# Patient Record
Sex: Male | Born: 1965 | Race: Black or African American | Hispanic: No | Marital: Single | Smoking: Former smoker
Health system: Southern US, Community
[De-identification: ages and names within clinical notes are randomized; demographics above are authoritative.]

## PROBLEM LIST (undated history)

## (undated) ENCOUNTER — Emergency Department: Admission: EM | Discharge status: Absent without leave | Payer: Medicare Other

## (undated) DIAGNOSIS — K219 Gastro-esophageal reflux disease without esophagitis: Secondary | ICD-10-CM

## (undated) DIAGNOSIS — I10 Essential (primary) hypertension: Secondary | ICD-10-CM

## (undated) HISTORY — PX: OTHER SURGICAL HISTORY: SHX169

---

## 2004-05-02 ENCOUNTER — Other Ambulatory Visit: Payer: Self-pay

## 2004-05-08 ENCOUNTER — Other Ambulatory Visit: Payer: Self-pay

## 2004-11-19 ENCOUNTER — Emergency Department: Payer: Self-pay | Admitting: Emergency Medicine

## 2004-12-18 ENCOUNTER — Emergency Department: Payer: Self-pay | Admitting: Emergency Medicine

## 2005-05-22 ENCOUNTER — Other Ambulatory Visit: Payer: Self-pay

## 2005-05-22 ENCOUNTER — Emergency Department: Payer: Self-pay | Admitting: Emergency Medicine

## 2005-07-16 ENCOUNTER — Inpatient Hospital Stay: Payer: Self-pay | Admitting: Psychiatry

## 2005-09-28 ENCOUNTER — Emergency Department: Payer: Self-pay | Admitting: Emergency Medicine

## 2005-09-28 ENCOUNTER — Other Ambulatory Visit: Payer: Self-pay

## 2005-11-19 ENCOUNTER — Emergency Department: Payer: Self-pay | Admitting: Internal Medicine

## 2005-11-20 ENCOUNTER — Emergency Department: Payer: Self-pay | Admitting: Emergency Medicine

## 2005-11-20 ENCOUNTER — Other Ambulatory Visit: Payer: Self-pay

## 2005-12-02 ENCOUNTER — Emergency Department: Payer: Self-pay | Admitting: Emergency Medicine

## 2006-03-19 ENCOUNTER — Other Ambulatory Visit: Payer: Self-pay

## 2006-03-19 ENCOUNTER — Emergency Department: Payer: Self-pay | Admitting: Internal Medicine

## 2006-06-12 ENCOUNTER — Other Ambulatory Visit: Payer: Self-pay

## 2006-06-12 ENCOUNTER — Emergency Department: Payer: Self-pay | Admitting: Emergency Medicine

## 2006-07-13 ENCOUNTER — Emergency Department: Payer: Self-pay | Admitting: Emergency Medicine

## 2006-07-13 ENCOUNTER — Other Ambulatory Visit: Payer: Self-pay

## 2006-07-16 ENCOUNTER — Ambulatory Visit: Payer: Self-pay | Admitting: Internal Medicine

## 2006-08-05 ENCOUNTER — Other Ambulatory Visit: Payer: Self-pay

## 2006-08-05 ENCOUNTER — Emergency Department: Payer: Self-pay | Admitting: Emergency Medicine

## 2006-08-10 ENCOUNTER — Other Ambulatory Visit: Payer: Self-pay

## 2006-08-10 ENCOUNTER — Emergency Department: Payer: Self-pay | Admitting: Emergency Medicine

## 2006-08-11 ENCOUNTER — Other Ambulatory Visit: Payer: Self-pay

## 2006-08-11 ENCOUNTER — Emergency Department: Payer: Self-pay | Admitting: Emergency Medicine

## 2008-03-22 ENCOUNTER — Ambulatory Visit: Payer: Self-pay | Admitting: Internal Medicine

## 2009-04-20 ENCOUNTER — Emergency Department: Payer: Self-pay | Admitting: Emergency Medicine

## 2009-05-16 ENCOUNTER — Ambulatory Visit: Payer: Self-pay | Admitting: Cardiovascular Disease

## 2010-01-31 ENCOUNTER — Emergency Department: Payer: Self-pay | Admitting: Emergency Medicine

## 2010-06-03 ENCOUNTER — Emergency Department: Payer: Self-pay | Admitting: Emergency Medicine

## 2011-06-16 ENCOUNTER — Emergency Department: Payer: Self-pay | Admitting: Emergency Medicine

## 2012-03-11 ENCOUNTER — Emergency Department: Payer: Self-pay | Admitting: Internal Medicine

## 2012-03-22 ENCOUNTER — Emergency Department: Payer: Self-pay | Admitting: Emergency Medicine

## 2012-05-20 ENCOUNTER — Emergency Department: Payer: Self-pay | Admitting: Emergency Medicine

## 2012-08-25 ENCOUNTER — Emergency Department: Payer: Self-pay | Admitting: Emergency Medicine

## 2012-08-25 LAB — URINALYSIS, COMPLETE
Bilirubin,UR: NEGATIVE
Ketone: NEGATIVE
Leukocyte Esterase: NEGATIVE
Protein: 30
RBC,UR: 14 /HPF (ref 0–5)
Squamous Epithelial: 1
WBC UR: 1 /HPF (ref 0–5)

## 2012-08-25 LAB — CBC
HGB: 13.3 g/dL (ref 13.0–18.0)
MCH: 29.9 pg (ref 26.0–34.0)
MCHC: 33.1 g/dL (ref 32.0–36.0)
MCV: 90 fL (ref 80–100)
Platelet: 236 10*3/uL (ref 150–440)
RDW: 12.8 % (ref 11.5–14.5)

## 2012-08-25 LAB — BASIC METABOLIC PANEL
BUN: 23 mg/dL — ABNORMAL HIGH (ref 7–18)
Chloride: 107 mmol/L (ref 98–107)
Creatinine: 1.47 mg/dL — ABNORMAL HIGH (ref 0.60–1.30)
EGFR (Non-African Amer.): 57 — ABNORMAL LOW
Glucose: 122 mg/dL — ABNORMAL HIGH (ref 65–99)
Osmolality: 288 (ref 275–301)
Potassium: 4.3 mmol/L (ref 3.5–5.1)

## 2012-08-25 LAB — DRUG SCREEN, URINE
Amphetamines, Ur Screen: NEGATIVE (ref ?–1000)
Cocaine Metabolite,Ur ~~LOC~~: NEGATIVE (ref ?–300)
MDMA (Ecstasy)Ur Screen: NEGATIVE (ref ?–500)
Opiate, Ur Screen: NEGATIVE (ref ?–300)
Tricyclic, Ur Screen: NEGATIVE (ref ?–1000)

## 2013-04-02 ENCOUNTER — Emergency Department: Payer: Self-pay | Admitting: Emergency Medicine

## 2013-08-25 ENCOUNTER — Emergency Department: Payer: Self-pay | Admitting: Emergency Medicine

## 2013-11-04 ENCOUNTER — Emergency Department: Payer: Self-pay | Admitting: Emergency Medicine

## 2014-03-04 ENCOUNTER — Emergency Department: Payer: Self-pay | Admitting: Emergency Medicine

## 2014-03-04 LAB — COMPREHENSIVE METABOLIC PANEL
ALK PHOS: 97 U/L
ALT: 29 U/L (ref 12–78)
Albumin: 4 g/dL (ref 3.4–5.0)
Anion Gap: 5 — ABNORMAL LOW (ref 7–16)
BUN: 13 mg/dL (ref 7–18)
Bilirubin,Total: 0.4 mg/dL (ref 0.2–1.0)
CHLORIDE: 103 mmol/L (ref 98–107)
CREATININE: 1.49 mg/dL — AB (ref 0.60–1.30)
Calcium, Total: 9.3 mg/dL (ref 8.5–10.1)
Co2: 30 mmol/L (ref 21–32)
EGFR (African American): >60
GFR CALC NON AF AMER: 55 — AB
Glucose: 130 mg/dL — ABNORMAL HIGH (ref 65–99)
OSMOLALITY: 278 (ref 275–301)
Potassium: 3.5 mmol/L (ref 3.5–5.1)
SGOT(AST): 38 U/L — ABNORMAL HIGH (ref 15–37)
SODIUM: 138 mmol/L (ref 136–145)
TOTAL PROTEIN: 7.9 g/dL (ref 6.4–8.2)

## 2014-03-04 LAB — CBC
HCT: 44.5 % (ref 40.0–52.0)
HGB: 15 g/dL (ref 13.0–18.0)
MCH: 30.5 pg (ref 26.0–34.0)
MCHC: 33.7 g/dL (ref 32.0–36.0)
MCV: 90 fL (ref 80–100)
PLATELETS: 224 10*3/uL (ref 150–440)
RBC: 4.93 10*6/uL (ref 4.40–5.90)
RDW: 13 % (ref 11.5–14.5)
WBC: 9.7 10*3/uL (ref 3.8–10.6)

## 2014-03-04 LAB — ETHANOL: Ethanol %: <0.003 % (ref 0.000–0.080)

## 2014-03-04 LAB — SALICYLATE LEVEL: Salicylates, Serum: 3.6 mg/dL — ABNORMAL HIGH

## 2014-03-04 LAB — ACETAMINOPHEN LEVEL: Acetaminophen: <2 ug/mL

## 2014-03-05 LAB — URINALYSIS, COMPLETE
BILIRUBIN, UR: NEGATIVE
Bacteria: NONE SEEN
Blood: NEGATIVE
GLUCOSE, UR: NEGATIVE mg/dL (ref 0–75)
Hyaline Cast: 1
Ketone: NEGATIVE
LEUKOCYTE ESTERASE: NEGATIVE
NITRITE: NEGATIVE
Ph: 5 (ref 4.5–8.0)
Protein: 30
RBC,UR: <1 /HPF (ref 0–5)
Specific Gravity: 1.015 (ref 1.003–1.030)
Squamous Epithelial: <1
WBC UR: 5 /HPF (ref 0–5)

## 2014-03-05 LAB — DRUG SCREEN, URINE

## 2014-03-07 ENCOUNTER — Emergency Department: Payer: Self-pay | Admitting: Emergency Medicine

## 2014-03-07 LAB — CBC
HCT: 44.3 % (ref 40.0–52.0)
HGB: 14.2 g/dL (ref 13.0–18.0)
MCH: 29 pg (ref 26.0–34.0)
MCHC: 32.1 g/dL (ref 32.0–36.0)
MCV: 91 fL (ref 80–100)
PLATELETS: 224 10*3/uL (ref 150–440)
RBC: 4.9 10*6/uL (ref 4.40–5.90)
RDW: 12.6 % (ref 11.5–14.5)
WBC: 9.9 10*3/uL (ref 3.8–10.6)

## 2014-03-07 LAB — DRUG SCREEN, URINE
Amphetamines, Ur Screen: NEGATIVE (ref ?–1000)
BARBITURATES, UR SCREEN: NEGATIVE (ref ?–200)
BENZODIAZEPINE, UR SCRN: NEGATIVE (ref ?–200)
CANNABINOID 50 NG, UR ~~LOC~~: NEGATIVE (ref ?–50)
COCAINE METABOLITE, UR ~~LOC~~: NEGATIVE (ref ?–300)
MDMA (Ecstasy)Ur Screen: NEGATIVE (ref ?–500)
Methadone, Ur Screen: NEGATIVE (ref ?–300)
Opiate, Ur Screen: NEGATIVE (ref ?–300)
Phencyclidine (PCP) Ur S: NEGATIVE (ref ?–25)
Tricyclic, Ur Screen: NEGATIVE (ref ?–1000)

## 2014-03-07 LAB — ETHANOL: Ethanol: <3 mg/dL

## 2014-03-07 LAB — COMPREHENSIVE METABOLIC PANEL
ALBUMIN: 4 g/dL (ref 3.4–5.0)
AST: 42 U/L — AB (ref 15–37)
Alkaline Phosphatase: 93 U/L
Anion Gap: 5 — ABNORMAL LOW (ref 7–16)
BUN: 12 mg/dL (ref 7–18)
Bilirubin,Total: 0.3 mg/dL (ref 0.2–1.0)
CREATININE: 1.47 mg/dL — AB (ref 0.60–1.30)
Calcium, Total: 8.9 mg/dL (ref 8.5–10.1)
Chloride: 102 mmol/L (ref 98–107)
Co2: 29 mmol/L (ref 21–32)
EGFR (African American): >60
EGFR (Non-African Amer.): 56 — ABNORMAL LOW
Glucose: 119 mg/dL — ABNORMAL HIGH (ref 65–99)
Osmolality: 273 (ref 275–301)
Potassium: 3.8 mmol/L (ref 3.5–5.1)
SGPT (ALT): 29 U/L (ref 12–78)
Sodium: 136 mmol/L (ref 136–145)
TOTAL PROTEIN: 7.7 g/dL (ref 6.4–8.2)

## 2014-03-07 LAB — URINALYSIS, COMPLETE
BACTERIA: NONE SEEN
BLOOD: NEGATIVE
Bilirubin,UR: NEGATIVE
GLUCOSE, UR: NEGATIVE mg/dL (ref 0–75)
KETONE: NEGATIVE
LEUKOCYTE ESTERASE: NEGATIVE
NITRITE: NEGATIVE
Ph: 5 (ref 4.5–8.0)
RBC,UR: 1 /HPF (ref 0–5)
SQUAMOUS EPITHELIAL: NONE SEEN
Specific Gravity: 1.012 (ref 1.003–1.030)
WBC UR: 3 /HPF (ref 0–5)

## 2014-11-10 ENCOUNTER — Emergency Department: Payer: Self-pay | Admitting: Internal Medicine

## 2014-11-10 LAB — CBC
HCT: 41.2 % (ref 40.0–52.0)
HGB: 13.3 g/dL (ref 13.0–18.0)
MCH: 29.7 pg (ref 26.0–34.0)
MCHC: 32.4 g/dL (ref 32.0–36.0)
MCV: 92 fL (ref 80–100)
Platelet: 203 10*3/uL (ref 150–440)
RBC: 4.5 10*6/uL (ref 4.40–5.90)
RDW: 12.5 % (ref 11.5–14.5)
WBC: 6.2 10*3/uL (ref 3.8–10.6)

## 2014-11-10 LAB — BASIC METABOLIC PANEL
Anion Gap: 5 — ABNORMAL LOW (ref 7–16)
BUN: 12 mg/dL (ref 7–18)
Calcium, Total: 8.3 mg/dL — ABNORMAL LOW (ref 8.5–10.1)
Chloride: 106 mmol/L (ref 98–107)
Co2: 28 mmol/L (ref 21–32)
Creatinine: 1.46 mg/dL — ABNORMAL HIGH (ref 0.60–1.30)
EGFR (African American): >60
EGFR (Non-African Amer.): 55 — ABNORMAL LOW
Glucose: 88 mg/dL (ref 65–99)
OSMOLALITY: 277 (ref 275–301)
POTASSIUM: 3.8 mmol/L (ref 3.5–5.1)
SODIUM: 139 mmol/L (ref 136–145)

## 2014-11-10 LAB — TROPONIN I

## 2015-03-11 ENCOUNTER — Emergency Department
Admission: EM | Admit: 2015-03-11 | Discharge: 2015-03-11 | Disposition: A | Discharge status: Home / Self Care | Payer: Medicare Other | Attending: Emergency Medicine | Admitting: Emergency Medicine

## 2015-03-11 DIAGNOSIS — I10 Essential (primary) hypertension: Secondary | ICD-10-CM | POA: Insufficient documentation

## 2015-03-11 DIAGNOSIS — Z79899 Other long term (current) drug therapy: Secondary | ICD-10-CM | POA: Insufficient documentation

## 2015-03-11 DIAGNOSIS — Y9289 Other specified places as the place of occurrence of the external cause: Secondary | ICD-10-CM | POA: Diagnosis not present

## 2015-03-11 DIAGNOSIS — Y9389 Activity, other specified: Secondary | ICD-10-CM | POA: Diagnosis not present

## 2015-03-11 DIAGNOSIS — S46211A Strain of muscle, fascia and tendon of other parts of biceps, right arm, initial encounter: Secondary | ICD-10-CM | POA: Diagnosis not present

## 2015-03-11 DIAGNOSIS — X58XXXA Exposure to other specified factors, initial encounter: Secondary | ICD-10-CM | POA: Insufficient documentation

## 2015-03-11 DIAGNOSIS — Z7951 Long term (current) use of inhaled steroids: Secondary | ICD-10-CM | POA: Diagnosis not present

## 2015-03-11 DIAGNOSIS — Z720 Tobacco use: Secondary | ICD-10-CM | POA: Diagnosis not present

## 2015-03-11 DIAGNOSIS — Y990 Civilian activity done for income or pay: Secondary | ICD-10-CM | POA: Insufficient documentation

## 2015-03-11 DIAGNOSIS — S4991XA Unspecified injury of right shoulder and upper arm, initial encounter: Secondary | ICD-10-CM | POA: Diagnosis present

## 2015-03-11 HISTORY — DX: Essential (primary) hypertension: I10

## 2015-03-11 MED ORDER — CYCLOBENZAPRINE HCL 10 MG PO TABS: 10.0000 mg | ORAL_TABLET | Freq: Three times a day (TID) | ORAL | Status: DC | PRN

## 2015-03-11 NOTE — ED Provider Notes (Signed)
[facility] Emergency Department Provider Note    ____________________________________________  Time seen: 0 857  I have reviewed the triage vital signs and the nursing notes.   HISTORY  Chief Complaint Arm Injury       HPI Trevor Harmon is a 49 y.o. male who presents with pain in the right upper arm. He reports he's been lifting heavy cases of water at work. Patient denies any recent injury. Denies history of the same. He has not been taking any medications over-the-counter.     Past Medical History  Diagnosis Date  . Hypertension     There are no active problems to display for this patient.   History reviewed. No pertinent past surgical history.  Current Outpatient Rx  Name  Route  Sig  Dispense  Refill  . ARIPiprazole (ABILIFY) 10 MG tablet   Oral   Take 10 mg by mouth daily.         . Fluticasone-Salmeterol (ADVAIR) 250-50 MCG/DOSE AEPB   Inhalation   Inhale 1 puff into the lungs 2 (two) times daily.         . potassium chloride (K-DUR) 10 MEQ tablet   Oral   Take 10 mEq by mouth daily.         . pravastatin (PRAVACHOL) 40 MG tablet   Oral   Take 40 mg by mouth daily.         . propranolol (INDERAL) 20 MG tablet   Oral   Take 20 mg by mouth 3 (three) times daily.         . ranitidine (ZANTAC) 150 MG tablet   Oral   Take 150 mg by mouth 2 (two) times daily.         . traZODone (DESYREL) 100 MG tablet   Oral   Take 100 mg by mouth at bedtime.         . triamterene-hydrochlorothiazide (DYAZIDE) 37.5-25 MG per capsule   Oral   Take 1 capsule by mouth daily.         . Vitamin D, Ergocalciferol, (DRISDOL) 50000 UNITS CAPS capsule   Oral   Take 50,000 Units by mouth every 7 (seven) days.         . cyclobenzaprine (FLEXERIL) 10 MG tablet   Oral   Take 1 tablet (10 mg total) by mouth 3 (three) times daily as needed for muscle spasms.   30 tablet   0     Allergies Review of patient's allergies  indicates no known allergies.  No family history on file.  Social History History  Substance Use Topics  . Smoking status: Current Every Day Smoker -- 0.50 packs/day    Types: Cigarettes  . Smokeless tobacco: Not on file  . Alcohol Use: No    Review of Systems  Constitutional: Negative for fever. Eyes: Negative for visual changes. ENT:  Cardiovascular: Negative for chest pain. Respiratory: Negative for shortness of breath. Gastrointestinal: Negative for abdominal pain, vomiting and diarrhea. Musculoskeletal: Negative for back pain. Skin: Negative for rash.  10-point ROS otherwise negative.  ____________________________________________   PHYSICAL EXAM:  VITAL SIGNS: ED Triage Vitals  Enc Vitals Group     BP 03/11/15 0811 121/78 mmHg     Pulse Rate 03/11/15 0811 62     Resp 03/11/15 0811 18     Temp 03/11/15 0811 98.4 F (36.9 C)     Temp Source 03/11/15 0811 Oral     SpO2 03/11/15 0811 98 %  Weight 03/11/15 0811 160 lb (72.576 kg)     Height 03/11/15 0811 6' (1.829 m)     Head Cir --      Peak Flow --      Pain Score 03/11/15 0811 10     Pain Loc --      Pain Edu? --      Excl. in GC? --      Constitutional: Alert and oriented. Well appearing and in no distress. Eyes: Conjunctivae are normal. PERRL. Normal extraocular movements. ENT   Head: Normocephalic and atraumatic.   Nose: No congestion/rhinnorhea.   Mouth/Throat: Mucous membranes are moist.   Neck: No stridor. Respiratory: Normal respiratory effort without tachypnea nor retractions. Gastrointestinal: Soft and nontender. Musculoskeletal: Tenderness over her bicep on the right upper extremity. Normal range of motion. No indication of trauma.  Neurologic:  Normal speech and language. No gross focal neurologic deficits are appreciated. Speech is normal. No gait instability. Skin:  Skin is warm, dry and intact. No rash noted. No contusion or ecchymosis. Psychiatric: Mood and affect are  normal. Speech and behavior are normal. Patient exhibits appropriate insight and judgment.  ____________________________________________   EKG    ____________________________________________    RADIOLOGY    ____________________________________________   PROCEDURES  Procedure(s) performed: None  Critical Care performed: No  ____________________________________________   INITIAL IMPRESSION / ASSESSMENT AND PLAN / ED COURSE  Pertinent labs & imaging results that were available during my care of the patient were reviewed by me and considered in my medical decision making (see chart for details).  Patient was prescribed Flexeril. Encouraged him to continue his NSAIDs. Advised to rest ice and elevate. Work note provided for the next 2 days. Patient to follow up with his primary care provider if he is not doing better over the next 5-7 days.  ____________________________________________   FINAL CLINICAL IMPRESSION(S) / ED DIAGNOSES  Final diagnoses:  Biceps muscle strain, right, initial encounter    Chinita PesterCari B Kegan Mckeithan, FNP 03/11/15 1041  Sharman CheekPhillip Stafford, MD 03/12/15 1314

## 2015-03-11 NOTE — ED Notes (Signed)
Pt reports no injury he knows of but last night after work his right arm started hurting.  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  Reports pain with movement to right shoulder and elbow. + pulse, no deformity. Cap refill < 3sec

## 2015-11-29 ENCOUNTER — Emergency Department
Admission: EM | Admit: 2015-11-29 | Discharge: 2015-11-29 | Disposition: A | Discharge status: Home / Self Care | Payer: Medicare Other | Attending: Emergency Medicine | Admitting: Emergency Medicine

## 2015-11-29 DIAGNOSIS — L03811 Cellulitis of head [any part, except face]: Secondary | ICD-10-CM | POA: Insufficient documentation

## 2015-11-29 DIAGNOSIS — F17210 Nicotine dependence, cigarettes, uncomplicated: Secondary | ICD-10-CM | POA: Diagnosis not present

## 2015-11-29 DIAGNOSIS — Z7951 Long term (current) use of inhaled steroids: Secondary | ICD-10-CM | POA: Diagnosis not present

## 2015-11-29 DIAGNOSIS — I10 Essential (primary) hypertension: Secondary | ICD-10-CM | POA: Diagnosis not present

## 2015-11-29 DIAGNOSIS — R220 Localized swelling, mass and lump, head: Secondary | ICD-10-CM | POA: Diagnosis present

## 2015-11-29 DIAGNOSIS — L739 Follicular disorder, unspecified: Secondary | ICD-10-CM

## 2015-11-29 DIAGNOSIS — Z79899 Other long term (current) drug therapy: Secondary | ICD-10-CM | POA: Diagnosis not present

## 2015-11-29 MED ORDER — SULFAMETHOXAZOLE-TRIMETHOPRIM 800-160 MG PO TABS: 1.0000 | ORAL_TABLET | Freq: Two times a day (BID) | ORAL | Status: DC

## 2015-11-29 NOTE — ED Provider Notes (Signed)
[facility] Emergency Department Provider Note  ____________________________________________  Time seen: Approximately 2:56 PM  I have reviewed the triage vital signs and the nursing notes.   HISTORY  Chief Complaint Abscess    HPI Trevor Harmon is a 50 y.o. male who presents to the emergency department complaining of a painful bump to the back of his head. Patient states that symptoms began yesterday. He states that his raise to palpation but he has been unable to visualize it. He denies any fevers or chills, headache, neck pain, chest pain, shortness breath, nausea or vomiting.   Past Medical History  Diagnosis Date  . Hypertension     There are no active problems to display for this patient.   History reviewed. No pertinent past surgical history.  Current Outpatient Rx  Name  Route  Sig  Dispense  Refill  . ARIPiprazole (ABILIFY) 10 MG tablet   Oral   Take 10 mg by mouth daily.         . cyclobenzaprine (FLEXERIL) 10 MG tablet   Oral   Take 1 tablet (10 mg total) by mouth 3 (three) times daily as needed for muscle spasms.   30 tablet   0   . Fluticasone-Salmeterol (ADVAIR) 250-50 MCG/DOSE AEPB   Inhalation   Inhale 1 puff into the lungs 2 (two) times daily.         . potassium chloride (K-DUR) 10 MEQ tablet   Oral   Take 10 mEq by mouth daily.         . pravastatin (PRAVACHOL) 40 MG tablet   Oral   Take 40 mg by mouth daily.         . propranolol (INDERAL) 20 MG tablet   Oral   Take 20 mg by mouth 3 (three) times daily.         . ranitidine (ZANTAC) 150 MG tablet   Oral   Take 150 mg by mouth 2 (two) times daily.         Marland Kitchen sulfamethoxazole-trimethoprim (BACTRIM DS,SEPTRA DS) 800-160 MG tablet   Oral   Take 1 tablet by mouth 2 (two) times daily.   14 tablet   0   . traZODone (DESYREL) 100 MG tablet   Oral   Take 100 mg by mouth at bedtime.         . triamterene-hydrochlorothiazide (DYAZIDE) 37.5-25 MG per  capsule   Oral   Take 1 capsule by mouth daily.         . Vitamin D, Ergocalciferol, (DRISDOL) 50000 UNITS CAPS capsule   Oral   Take 50,000 Units by mouth every 7 (seven) days.           Allergies Review of patient's allergies indicates no known allergies.  No family history on file.  Social History Social History  Substance Use Topics  . Smoking status: Current Every Day Smoker -- 0.50 packs/day    Types: Cigarettes  . Smokeless tobacco: None  . Alcohol Use: No     Review of Systems  Constitutional: No fever/chills Eyes: No visual changes. No discharge ENT: No sore throat. Cardiovascular: no chest pain. Respiratory: no cough. No SOB. Gastrointestinal: No abdominal pain.  No nausea, no vomiting.  No diarrhea.  No constipation. Genitourinary: Negative for dysuria. No hematuria Musculoskeletal: Negative for back pain. Skin: Negative for rash. Endorses skin lesion to the back of his head. Neurological: Negative for headaches, focal weakness or numbness. 10-point ROS otherwise negative.  ____________________________________________  PHYSICAL EXAM:  VITAL SIGNS: ED Triage Vitals  Enc Vitals Group     BP 11/29/15 1431 151/88 mmHg     Pulse Rate 11/29/15 1431 66     Resp 11/29/15 1431 16     Temp 11/29/15 1431 97.6 F (36.4 C)     Temp Source 11/29/15 1431 Oral     SpO2 11/29/15 1431 96 %     Weight 11/29/15 1431 180 lb (81.647 kg)     Height 11/29/15 1431 6' (1.829 m)     Head Cir --      Peak Flow --      Pain Score 11/29/15 1427 10     Pain Loc --      Pain Edu? --      Excl. in GC? --      Constitutional: Alert and oriented. Well appearing and in no acute distress. Eyes: Conjunctivae are normal. PERRL. EOMI. Head: Atraumatic. ENT:      Ears:       Nose: No congestion/rhinnorhea.      Mouth/Throat: Mucous membranes are moist.  Neck: No stridor.   Hematological/Lymphatic/Immunilogical: No cervical lymphadenopathy. Cardiovascular: Normal rate,  regular rhythm. Normal S1 and S2.  Good peripheral circulation. Respiratory: Normal respiratory effort without tachypnea or retractions. Lungs CTAB. Gastrointestinal: Soft and nontender. No distention. No CVA tenderness. Musculoskeletal: No lower extremity tenderness nor edema.  No joint effusions. Neurologic:  Normal speech and language. No gross focal neurologic deficits are appreciated.  Skin:  Skin is warm, dry and intact. No rash noted. Erythematous and edematous nodule noted to the back of her head. No fluctuance noted. Areas firm to palpation. Area is tender to palpation. No drainage noted. Psychiatric: Mood and affect are normal. Speech and behavior are normal. Patient exhibits appropriate insight and judgement.   ____________________________________________   LABS (all labs ordered are listed, but only abnormal results are displayed)  Labs Reviewed - No data to display ____________________________________________  EKG   ____________________________________________  RADIOLOGY   No results found.  ____________________________________________    PROCEDURES  Procedure(s) performed:       Medications - No data to display   ____________________________________________   INITIAL IMPRESSION / ASSESSMENT AND PLAN / ED COURSE  Pertinent labs & imaging results that were available during my care of the patient were reviewed by me and considered in my medical decision making (see chart for details).  Patient's diagnosis is consistent with folliculitis with with surrounding cellulitis. Patient will be discharged home with prescriptions for Bactrim. Patient is to follow up with primary care provider if symptoms persist past this treatment course. Patient is given ED precautions to return to the ED for any worsening or new symptoms.     ____________________________________________  FINAL CLINICAL IMPRESSION(S) / ED DIAGNOSES  Final diagnoses:  Folliculitis   Cellulitis of head except face      NEW MEDICATIONS STARTED DURING THIS VISIT:  New Prescriptions   SULFAMETHOXAZOLE-TRIMETHOPRIM (BACTRIM DS,SEPTRA DS) 800-160 MG TABLET    Take 1 tablet by mouth 2 (two) times daily.        Delorise Royals Cuthriell, PA-C 11/29/15 1502  Sharyn Creamer, MD 11/29/15 1714

## 2015-11-29 NOTE — ED Notes (Signed)
Pt states he noticed yesterday on the back of his head a spot that was sore and itching. Denies any bleeding. Pt c/o pain to area currently 10/10

## 2015-11-29 NOTE — Discharge Instructions (Signed)
Cellulitis °Cellulitis is an infection of the skin and the tissue beneath it. The infected area is usually red and tender. Cellulitis occurs most often in the arms and lower legs.  °CAUSES  °Cellulitis is caused by bacteria that enter the skin through cracks or cuts in the skin. The most common types of bacteria that cause cellulitis are staphylococci and streptococci. °SIGNS AND SYMPTOMS  °· Redness and warmth. °· Swelling. °· Tenderness or pain. °· Fever. °DIAGNOSIS  °Your health care provider can usually determine what is wrong based on a physical exam. Blood tests may also be done. °TREATMENT  °Treatment usually involves taking an antibiotic medicine. °HOME CARE INSTRUCTIONS  °· Take your antibiotic medicine as directed by your health care provider. Finish the antibiotic even if you start to feel better. °· Keep the infected arm or leg elevated to reduce swelling. °· Apply a warm cloth to the affected area up to 4 times per day to relieve pain. °· Take medicines only as directed by your health care provider. °· Keep all follow-up visits as directed by your health care provider. °SEEK MEDICAL CARE IF:  °· You notice red streaks coming from the infected area. °· Your red area gets larger or turns dark in color. °· Your bone or joint underneath the infected area becomes painful after the skin has healed. °· Your infection returns in the same area or another area. °· You notice a swollen bump in the infected area. °· You develop new symptoms. °· You have a fever. °SEEK IMMEDIATE MEDICAL CARE IF:  °· You feel very sleepy. °· You develop vomiting or diarrhea. °· You have a general ill feeling (malaise) with muscle aches and pains. °  °This information is not intended to replace advice given to you by your health care provider. Make sure you discuss any questions you have with your health care provider. °  °Document Released: 08/05/2005 Document Revised: 07/17/2015 Document Reviewed: 01/11/2012 °Elsevier Interactive  Patient Education ©2016 [employer] ° °Folliculitis °Folliculitis is redness, soreness, and swelling (inflammation) of the hair follicles. This condition can occur anywhere on the body. People with weakened immune systems, diabetes, or obesity have a greater risk of getting folliculitis. °CAUSES °· Bacterial infection. This is the most common cause. °· Fungal infection. °· Viral infection. °· Contact with certain chemicals, especially oils and tars. °Long-term folliculitis can result from bacteria that live in the nostrils. The bacteria may trigger multiple outbreaks of folliculitis over time. °SYMPTOMS °Folliculitis most commonly occurs on the scalp, thighs, legs, back, buttocks, and areas where hair is shaved frequently. An early sign of folliculitis is a small, white or yellow, pus-filled, itchy lesion (pustule). These lesions appear on a red, inflamed follicle. They are usually less than 0.2 inches (5 mm) wide. When there is an infection of the follicle that goes deeper, it becomes a boil or furuncle. A group of closely packed boils creates a larger lesion (carbuncle). Carbuncles tend to occur in hairy, sweaty areas of the body. °DIAGNOSIS  °Your caregiver can usually tell what is wrong by doing a physical exam. A sample may be taken from one of the lesions and tested in a lab. This can help determine what is causing your folliculitis. °TREATMENT  °Treatment may include: °· Applying warm compresses to the affected areas. °· Taking antibiotic medicines orally or applying them to the skin. °· Draining the lesions if they contain a large amount of pus or fluid. °· Laser hair removal for cases of long-lasting   folliculitis. This helps to prevent regrowth of the hair. HOME CARE INSTRUCTIONS  Apply warm compresses to the affected areas as directed by your caregiver.  If antibiotics are prescribed, take them as directed. Finish them even if you start to feel better.  You may take over-the-counter medicines to  relieve itching.  Do not shave irritated skin.  Follow up with your caregiver as directed. SEEK IMMEDIATE MEDICAL CARE IF:   You have increasing redness, swelling, or pain in the affected area.  You have a fever. MAKE SURE YOU:  Understand these instructions.  Will watch your condition.  Will get help right away if you are not doing well or get worse.   This information is not intended to replace advice given to you by your health care provider. Make sure you discuss any questions you have with your health care provider.   Document Released: 01/04/2002 Document Revised: 11/16/2014 Document Reviewed: 01/26/2012 Elsevier Interactive Patient Education [employer]

## 2015-11-29 NOTE — ED Notes (Signed)
Pt c/o abscess to the back of the head that started yesterday.

## 2016-04-04 ENCOUNTER — Emergency Department: Payer: Medicare Other

## 2016-04-04 ENCOUNTER — Emergency Department
Admission: EM | Admit: 2016-04-04 | Discharge: 2016-04-04 | Disposition: A | Discharge status: Home / Self Care | Payer: Medicare Other | Attending: Emergency Medicine | Admitting: Emergency Medicine

## 2016-04-04 DIAGNOSIS — Y9389 Activity, other specified: Secondary | ICD-10-CM | POA: Insufficient documentation

## 2016-04-04 DIAGNOSIS — X58XXXA Exposure to other specified factors, initial encounter: Secondary | ICD-10-CM | POA: Insufficient documentation

## 2016-04-04 DIAGNOSIS — Z79899 Other long term (current) drug therapy: Secondary | ICD-10-CM | POA: Diagnosis not present

## 2016-04-04 DIAGNOSIS — S96911A Strain of unspecified muscle and tendon at ankle and foot level, right foot, initial encounter: Secondary | ICD-10-CM | POA: Diagnosis not present

## 2016-04-04 DIAGNOSIS — Y999 Unspecified external cause status: Secondary | ICD-10-CM | POA: Diagnosis not present

## 2016-04-04 DIAGNOSIS — F17210 Nicotine dependence, cigarettes, uncomplicated: Secondary | ICD-10-CM | POA: Insufficient documentation

## 2016-04-04 DIAGNOSIS — I10 Essential (primary) hypertension: Secondary | ICD-10-CM | POA: Insufficient documentation

## 2016-04-04 DIAGNOSIS — Y92007 Garden or yard of unspecified non-institutional (private) residence as the place of occurrence of the external cause: Secondary | ICD-10-CM | POA: Insufficient documentation

## 2016-04-04 DIAGNOSIS — M79671 Pain in right foot: Secondary | ICD-10-CM | POA: Diagnosis present

## 2016-04-04 MED ORDER — IBUPROFEN 800 MG PO TABS: 800.0000 mg | ORAL_TABLET | Freq: Three times a day (TID) | ORAL | Status: DC | PRN

## 2016-04-04 MED ORDER — BACLOFEN 10 MG PO TABS: 10.0000 mg | ORAL_TABLET | Freq: Three times a day (TID) | ORAL | Status: DC

## 2016-04-04 NOTE — Discharge Instructions (Signed)
Muscle Strain A muscle strain is an injury that occurs when a muscle is stretched beyond its normal length. Usually a small number of muscle fibers are torn when this happens. Muscle strain is rated in degrees. First-degree strains have the least amount of muscle fiber tearing and pain. Second-degree and third-degree strains have increasingly more tearing and pain.  Usually, recovery from muscle strain takes 1-2 weeks. Complete healing takes 5-6 weeks.  CAUSES  Muscle strain happens when a sudden, violent force placed on a muscle stretches it too far. This may occur with lifting, sports, or a fall.  RISK FACTORS Muscle strain is especially common in athletes.  SIGNS AND SYMPTOMS At the site of the muscle strain, there may be:  Pain.  Bruising.  Swelling.  Difficulty using the muscle due to pain or lack of normal function. DIAGNOSIS  Your health care provider will perform a physical exam and ask about your medical history. TREATMENT  Often, the best treatment for a muscle strain is resting, icing, and applying cold compresses to the injured area.  HOME CARE INSTRUCTIONS   Use the PRICE method of treatment to promote muscle healing during the first 2-3 days after your injury. The PRICE method involves:  Protecting the muscle from being injured again.  Restricting your activity and resting the injured body part.  Icing your injury. To do this, put ice in a plastic bag. Place a towel between your skin and the bag. Then, apply the ice and leave it on from 15-20 minutes each hour. After the third day, switch to moist heat packs.  Apply compression to the injured area with a splint or elastic bandage. Be careful not to wrap it too tightly. This may interfere with blood circulation or increase swelling.  Elevate the injured body part above the level of your heart as often as you can.  Only take over-the-counter or prescription medicines for pain, discomfort, or fever as directed by your  health care provider.  Warming up prior to exercise helps to prevent future muscle strains. SEEK MEDICAL CARE IF:   You have increasing pain or swelling in the injured area.  You have numbness, tingling, or a significant loss of strength in the injured area. MAKE SURE YOU:   Understand these instructions.  Will watch your condition.  Will get help right away if you are not doing well or get worse.   This information is not intended to replace advice given to you by your health care provider. Make sure you discuss any questions you have with your health care provider.   Document Released: 10/26/2005 Document Revised: 08/16/2013 Document Reviewed: 05/25/2013 Elsevier Interactive Patient Education 2016 [employer]  Cryotherapy Cryotherapy means treatment with cold. Ice or gel packs can be used to reduce both pain and swelling. Ice is the most helpful within the first 24 to 48 hours after an injury or flare-up from overusing a muscle or joint. Sprains, strains, spasms, burning pain, shooting pain, and aches can all be eased with ice. Ice can also be used when recovering from surgery. Ice is effective, has very few side effects, and is safe for most people to use. PRECAUTIONS  Ice is not a safe treatment option for people with:  Raynaud phenomenon. This is a condition affecting small blood vessels in the extremities. Exposure to cold may cause your problems to return.  Cold hypersensitivity. There are many forms of cold hypersensitivity, including:  Cold urticaria. Red, itchy hives appear on the skin when the  tissues begin to warm after being iced.  Cold erythema. This is a red, itchy rash caused by exposure to cold.  Cold hemoglobinuria. Red blood cells break down when the tissues begin to warm after being iced. The hemoglobin that carry oxygen are passed into the urine because they cannot combine with blood proteins fast enough.  Numbness or altered sensitivity in the area being  iced. If you have any of the following conditions, do not use ice until you have discussed cryotherapy with your caregiver:  Heart conditions, such as arrhythmia, angina, or chronic heart disease.  High blood pressure.  Healing wounds or open skin in the area being iced.  Current infections.  Rheumatoid arthritis.  Poor circulation.  Diabetes. Ice slows the blood flow in the region it is applied. This is beneficial when trying to stop inflamed tissues from spreading irritating chemicals to surrounding tissues. However, if you expose your skin to cold temperatures for too long or without the proper protection, you can damage your skin or nerves. Watch for signs of skin damage due to cold. HOME CARE INSTRUCTIONS Follow these tips to use ice and cold packs safely.  Place a dry or damp towel between the ice and skin. A damp towel will cool the skin more quickly, so you may need to shorten the time that the ice is used.  For a more rapid response, add gentle compression to the ice.  Ice for no more than 10 to 20 minutes at a time. The bonier the area you are icing, the less time it will take to get the benefits of ice.  Check your skin after 5 minutes to make sure there are no signs of a poor response to cold or skin damage.  Rest 20 minutes or more between uses.  Once your skin is numb, you can end your treatment. You can test numbness by very lightly touching your skin. The touch should be so light that you do not see the skin dimple from the pressure of your fingertip. When using ice, most people will feel these normal sensations in this order: cold, burning, aching, and numbness.  Do not use ice on someone who cannot communicate their responses to pain, such as small children or people with dementia. HOW TO MAKE AN ICE PACK Ice packs are the most common way to use ice therapy. Other methods include ice massage, ice baths, and cryosprays. Muscle creams that cause a cold, tingly  feeling do not offer the same benefits that ice offers and should not be used as a substitute unless recommended by your caregiver. To make an ice pack, do one of the following:  Place crushed ice or a bag of frozen vegetables in a sealable plastic bag. Squeeze out the excess air. Place this bag inside another plastic bag. Slide the bag into a pillowcase or place a damp towel between your skin and the bag.  Mix 3 parts water with 1 part rubbing alcohol. Freeze the mixture in a sealable plastic bag. When you remove the mixture from the freezer, it will be slushy. Squeeze out the excess air. Place this bag inside another plastic bag. Slide the bag into a pillowcase or place a damp towel between your skin and the bag. SEEK MEDICAL CARE IF:  You develop white spots on your skin. This may give the skin a blotchy (mottled) appearance.  Your skin turns blue or pale.  Your skin becomes waxy or hard.  Your swelling gets worse. MAKE SURE  YOU:   Understand these instructions.  Will watch your condition.  Will get help right away if you are not doing well or get worse.   This information is not intended to replace advice given to you by your health care provider. Make sure you discuss any questions you have with your health care provider.   Document Released: 06/22/2011 Document Revised: 11/16/2014 Document Reviewed: 06/22/2011 Elsevier Interactive Patient Education 2016 [employer]  Ankle Sprain An ankle sprain is an injury to the strong, fibrous tissues (ligaments) that hold the bones of your ankle joint together.  CAUSES An ankle sprain is usually caused by a fall or by twisting your ankle. Ankle sprains most commonly occur when you step on the outer edge of your foot, and your ankle turns inward. People who participate in sports are more prone to these types of injuries.  SYMPTOMS   Pain in your ankle. The pain may be present at rest or only when you are trying to stand or  walk.  Swelling.  Bruising. Bruising may develop immediately or within 1 to 2 days after your injury.  Difficulty standing or walking, particularly when turning corners or changing directions. DIAGNOSIS  Your caregiver will ask you details about your injury and perform a physical exam of your ankle to determine if you have an ankle sprain. During the physical exam, your caregiver will press on and apply pressure to specific areas of your foot and ankle. Your caregiver will try to move your ankle in certain ways. An X-ray exam may be done to be sure a bone was not broken or a ligament did not separate from one of the bones in your ankle (avulsion fracture).  TREATMENT  Certain types of braces can help stabilize your ankle. Your caregiver can make a recommendation for this. Your caregiver may recommend the use of medicine for pain. If your sprain is severe, your caregiver may refer you to a surgeon who helps to restore function to parts of your skeletal system (orthopedist) or a physical therapist. HOME CARE INSTRUCTIONS   Apply ice to your injury for 1-2 days or as directed by your caregiver. Applying ice helps to reduce inflammation and pain.  Put ice in a plastic bag.  Place a towel between your skin and the bag.  Leave the ice on for 15-20 minutes at a time, every 2 hours while you are awake.  Only take over-the-counter or prescription medicines for pain, discomfort, or fever as directed by your caregiver.  Elevate your injured ankle above the level of your heart as much as possible for 2-3 days.  If your caregiver recommends crutches, use them as instructed. Gradually put weight on the affected ankle. Continue to use crutches or a cane until you can walk without feeling pain in your ankle.  If you have a plaster splint, wear the splint as directed by your caregiver. Do not rest it on anything harder than a pillow for the first 24 hours. Do not put weight on it. Do not get it wet. You  may take it off to take a shower or bath.  You may have been given an elastic bandage to wear around your ankle to provide support. If the elastic bandage is too tight (you have numbness or tingling in your foot or your foot becomes cold and blue), adjust the bandage to make it comfortable.  If you have an air splint, you may blow more air into it or let air out to make  it more comfortable. You may take your splint off at night and before taking a shower or bath. Wiggle your toes in the splint several times per day to decrease swelling. SEEK MEDICAL CARE IF:   You have rapidly increasing bruising or swelling.  Your toes feel extremely cold or you lose feeling in your foot.  Your pain is not relieved with medicine. SEEK IMMEDIATE MEDICAL CARE IF:  Your toes are numb or blue.  You have severe pain that is increasing. MAKE SURE YOU:   Understand these instructions.  Will watch your condition.  Will get help right away if you are not doing well or get worse.   This information is not intended to replace advice given to you by your health care provider. Make sure you discuss any questions you have with your health care provider.   Document Released: 10/26/2005 Document Revised: 11/16/2014 Document Reviewed: 11/07/2011 Elsevier Interactive Patient Education [employer]

## 2016-04-04 NOTE — ED Provider Notes (Signed)
[facility] Emergency Department Provider Note  ____________________________________________  Time seen: Approximately 7:04 AM  I have reviewed the triage vital signs and the nursing notes.   HISTORY  Chief Complaint Foot Pain    HPI Carlye GrippeJohn A Bowe is a 50 y.o. male patient who complains of right foot pain dorsally making it difficult to walk. Describes pain as 10 over 10. States that he hurt it the other day working in the yard with does not know any specific injury which would cause the pain. He is able to ambulate but hurts when he walks. Pain is right across the top. Does not think he broke any bones.   Past Medical History  Diagnosis Date  . Hypertension     There are no active problems to display for this patient.   No past surgical history on file.  Current Outpatient Rx  Name  Route  Sig  Dispense  Refill  . ARIPiprazole (ABILIFY) 10 MG tablet   Oral   Take 10 mg by mouth daily.         . baclofen (LIORESAL) 10 MG tablet   Oral   Take 1 tablet (10 mg total) by mouth 3 (three) times daily.   30 tablet   0   . Fluticasone-Salmeterol (ADVAIR) 250-50 MCG/DOSE AEPB   Inhalation   Inhale 1 puff into the lungs 2 (two) times daily.         Marland Kitchen. ibuprofen (ADVIL,MOTRIN) 800 MG tablet   Oral   Take 1 tablet (800 mg total) by mouth every 8 (eight) hours as needed.   30 tablet   0   . potassium chloride (K-DUR) 10 MEQ tablet   Oral   Take 10 mEq by mouth daily.         . pravastatin (PRAVACHOL) 40 MG tablet   Oral   Take 40 mg by mouth daily.         . propranolol (INDERAL) 20 MG tablet   Oral   Take 20 mg by mouth 3 (three) times daily.         . ranitidine (ZANTAC) 150 MG tablet   Oral   Take 150 mg by mouth 2 (two) times daily.         . traZODone (DESYREL) 100 MG tablet   Oral   Take 100 mg by mouth at bedtime.         . triamterene-hydrochlorothiazide (DYAZIDE) 37.5-25 MG per capsule   Oral   Take 1 capsule by  mouth daily.         . Vitamin D, Ergocalciferol, (DRISDOL) 50000 UNITS CAPS capsule   Oral   Take 50,000 Units by mouth every 7 (seven) days.           Allergies Review of patient's allergies indicates no known allergies.  No family history on file.  Social History Social History  Substance Use Topics  . Smoking status: Current Every Day Smoker -- 0.50 packs/day    Types: Cigarettes  . Smokeless tobacco: Not on file  . Alcohol Use: No    Review of Systems Constitutional: No fever/chills Musculoskeletal: Positive for right foot pain. Skin: Negative for rash. Neurological: Negative for headaches, focal weakness or numbness.  10-point ROS otherwise negative.  ____________________________________________   PHYSICAL EXAM:  VITAL SIGNS: ED Triage Vitals  Enc Vitals Group     BP 04/04/16 0553 159/111 mmHg     Pulse Rate 04/04/16 0553 56     Resp 04/04/16 0553  18     Temp 04/04/16 0553 98 F (36.7 C)     Temp Source 04/04/16 0553 Oral     SpO2 04/04/16 0553 97 %     Weight 04/04/16 0553 180 lb (81.647 kg)     Height 04/04/16 0553 6' (1.829 m)     Head Cir --      Peak Flow --      Pain Score 04/04/16 0555 10     Pain Loc --      Pain Edu? --      Excl. in GC? --     Constitutional: Alert and oriented. Well appearing and in no acute distress. Musculoskeletal: No lower extremity tenderness nor edema.  No joint effusions. Right foot. Range of motion. Distally neurovascularly intact. Neurologic:  Normal speech and language. No gross focal neurologic deficits are appreciated. No gait instability. Skin:  Skin is warm, dry and intact. No rash noted. Psychiatric: Mood and affect are normal. Speech and behavior are normal.  ____________________________________________   LABS (all labs ordered are listed, but only abnormal results are displayed)  Labs Reviewed - No data to display ____________________________________________   RADIOLOGY  Deferred at this  visit ____________________________________________   PROCEDURES  Procedure(s) performed: None  Critical Care performed: No  ____________________________________________   INITIAL IMPRESSION / ASSESSMENT AND PLAN / ED COURSE  Pertinent labs & imaging results that were available during my care of the patient were reviewed by me and considered in my medical decision making (see chart for details).  Nonspecific right foot pain. We'll try ibuprofen 800 mg 3 times a day. Patient follow-up PCP or return to ER with any worsening symptomology. ____________________________________________   FINAL CLINICAL IMPRESSION(S) / ED DIAGNOSES  Final diagnoses:  Right foot strain, initial encounter     This chart was dictated using voice recognition software/Dragon. Despite best efforts to proofread, errors can occur which can change the meaning. Any change was purely unintentional.   Evangeline Dakin, PA-C 04/04/16 0720  Minna Antis, MD 04/04/16 [phone]

## 2016-04-04 NOTE — ED Notes (Signed)
Patient reports right foot pain, "across the top" and reports makes it difficult to walk.  Patient denies any type of injury.  Patient ambulatory with slow, but steady gait.

## 2016-09-16 ENCOUNTER — Ambulatory Visit
Admission: RE | Admit: 2016-09-16 | Discharge: 2016-09-16 | Disposition: A | Discharge status: Home / Self Care | Payer: Medicare Other | Source: Ambulatory Visit | Attending: Internal Medicine | Admitting: Internal Medicine

## 2016-09-16 ENCOUNTER — Other Ambulatory Visit: Payer: Self-pay | Admitting: Internal Medicine

## 2016-09-16 DIAGNOSIS — M25562 Pain in left knee: Secondary | ICD-10-CM

## 2018-03-22 ENCOUNTER — Encounter: Payer: Self-pay | Admitting: Emergency Medicine

## 2018-03-22 ENCOUNTER — Emergency Department: Payer: Medicare Other

## 2018-03-22 ENCOUNTER — Emergency Department
Admission: EM | Admit: 2018-03-22 | Discharge: 2018-03-22 | Disposition: A | Discharge status: Home / Self Care | Payer: Medicare Other | Attending: Emergency Medicine | Admitting: Emergency Medicine

## 2018-03-22 DIAGNOSIS — I10 Essential (primary) hypertension: Secondary | ICD-10-CM | POA: Diagnosis not present

## 2018-03-22 DIAGNOSIS — R519 Headache, unspecified: Secondary | ICD-10-CM

## 2018-03-22 DIAGNOSIS — Z7902 Long term (current) use of antithrombotics/antiplatelets: Secondary | ICD-10-CM | POA: Diagnosis not present

## 2018-03-22 DIAGNOSIS — R51 Headache: Secondary | ICD-10-CM | POA: Diagnosis not present

## 2018-03-22 DIAGNOSIS — F17210 Nicotine dependence, cigarettes, uncomplicated: Secondary | ICD-10-CM | POA: Insufficient documentation

## 2018-03-22 DIAGNOSIS — Z79899 Other long term (current) drug therapy: Secondary | ICD-10-CM | POA: Diagnosis not present

## 2018-03-22 MED ORDER — BUTALBITAL-APAP-CAFFEINE 50-325-40 MG PO TABS
2.0000 | ORAL_TABLET | ORAL | Status: AC
  Administered 2018-03-22: 2 via ORAL
  Filled 2018-03-22: qty 2

## 2018-03-22 NOTE — ED Provider Notes (Signed)
[facility] Emergency Department Provider Note  ____________________________________________   First MD Initiated Contact with Patient 03/22/18 1743     (approximate)  I have reviewed the triage vital signs and the nursing notes.   HISTORY  Chief Complaint Headache    HPI Trevor Harmon is a 52 y.o. male who presents for evaluation of about 2 days of bilateral front headache.  Gradual in onset, no recent history of trauma.  No photophobia no visual changes, no nausea, no vomiting, no chest pain, no shortness of breath.  The patient has had no numbness nor tingling in any of his extremities.  He has no difficulty with ambulation.  He has not tried taking anything for his headache.  He reportedly does have some hypertension but this is chronic.  He is in no acute distress at this time, watching TV, continues to complain of a mild headache.  He says at times it is severe and nothing in particular makes it better or worse.  Past Medical History:  Diagnosis Date  . Hypertension     There are no active problems to display for this patient.   Past Surgical History:  Procedure Laterality Date  . OTHER SURGICAL HISTORY     stent kidney    Prior to Admission medications   Medication Sig Start Date End Date Taking? Authorizing Provider  ARIPiprazole (ABILIFY) 10 MG tablet Take 10 mg by mouth daily.    [provider]  baclofen (LIORESAL) 10 MG tablet Take 1 tablet (10 mg total) by mouth 3 (three) times daily. 04/04/16   Beers, Charmayne Sheer, PA-C  Fluticasone-Salmeterol (ADVAIR) 250-50 MCG/DOSE AEPB Inhale 1 puff into the lungs 2 (two) times daily.    [provider]  ibuprofen (ADVIL,MOTRIN) 800 MG tablet Take 1 tablet (800 mg total) by mouth every 8 (eight) hours as needed. 04/04/16   Beers, Charmayne Sheer, PA-C  potassium chloride (K-DUR) 10 MEQ tablet Take 10 mEq by mouth daily.    [provider]  pravastatin (PRAVACHOL) 40 MG tablet Take 40  mg by mouth daily.    [provider]  propranolol (INDERAL) 20 MG tablet Take 20 mg by mouth 3 (three) times daily.    [provider]  ranitidine (ZANTAC) 150 MG tablet Take 150 mg by mouth 2 (two) times daily.    [provider]  traZODone (DESYREL) 100 MG tablet Take 100 mg by mouth at bedtime.    [provider]  triamterene-hydrochlorothiazide (DYAZIDE) 37.5-25 MG per capsule Take 1 capsule by mouth daily.    [provider]  Vitamin D, Ergocalciferol, (DRISDOL) 50000 UNITS CAPS capsule Take 50,000 Units by mouth every 7 (seven) days.    [provider]    Allergies Patient has no known allergies.  No family history on file.  Social History Social History   Tobacco Use  . Smoking status: Current Every Day Smoker    Packs/day: 0.50    Types: Cigarettes  . Smokeless tobacco: Never Used  Substance Use Topics  . Alcohol use: No  . Drug use: No    Review of Systems Constitutional: No fever/chills Eyes: No visual changes. ENT: No sore throat. Cardiovascular: Denies chest pain. Respiratory: Denies shortness of breath. Gastrointestinal: No abdominal pain.  No nausea, no vomiting.  No diarrhea.  No constipation. Genitourinary: Negative for dysuria. Musculoskeletal: Negative for neck pain.  Negative for back pain. Integumentary: Negative for rash. Neurological: Frontal bitemporal headache x2 days as described above.  No numbness nor weakness in any of his extremities.   ____________________________________________   PHYSICAL EXAM:  VITAL SIGNS: ED Triage Vitals [03/22/18 1346]  Enc Vitals Group     BP 130/80     Pulse Rate 62     Resp 16     Temp 97.6 F (36.4 C)     Temp Source Oral     SpO2 97 %     Weight 90.7 kg (200 lb)     Height 1.829 m (6')     Head Circumference      Peak Flow      Pain Score 10     Pain Loc      Pain Edu?      Excl. in GC?    Hopefully the Constitutional: Alert and oriented.  Well appearing and in no acute distress. Eyes: Conjunctivae are normal. PERRL. EOMI. Head: Atraumatic. Nose: No congestion/rhinnorhea. Mouth/Throat: Mucous membranes are moist. Neck: No stridor.  No meningeal signs.   Cardiovascular: Normal rate, regular rhythm. Good peripheral circulation. Grossly normal heart sounds. Respiratory: Normal respiratory effort.  No retractions. Lungs CTAB. Gastrointestinal: Soft and nontender. No distention.  Musculoskeletal: No lower extremity tenderness nor edema. No gross deformities of extremities. Neurologic:  Normal speech and language. No gross focal neurologic deficits are appreciated.  Skin:  Skin is warm, dry and intact. No rash noted.   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 History of hypertension and subdural hematoma. EXAM: CT HEAD WITHOUT CONTRAST TECHNIQUE: Contiguous axial images were obtained from the base of the skull through the vertex without intravenous contrast. COMPARISON:  CT HEAD August 25, 2012 FINDINGS: BRAIN: No intraparenchymal hemorrhage, mass effect nor midline shift. LEFT frontotemporal encephalomalacia. Mild parenchymal brain volume loss for age. No hydrocephalus. Patchy LEFT parietal white matter hypodensities suggesting small vessel ischemic changes. No acute large vascular territory infarcts. No abnormal extra-axial fluid collections.  Basal cisterns are patent. VASCULAR: Unremarkable. SKULL/SOFT TISSUES: No skull fracture. Healed RIGHT frontotemporal calvarial fracture. No significant soft tissue swelling. ORBITS/SINUSES: The included ocular globes and orbital contents are normal.The mastoid aircells and included paranasal sinuses are well-aerated. OTHER: None. IMPRESSION: 1. No acute intracranial process. 2. LEFT frontotemporal encephalomalacia consistent with TBI. 3. Mild parenchymal brain volume loss. Mild chronic small vessel ischemic changes. Electronically Signed   By: Awilda Metro M.D.   On: 03/22/2018 15:38    ____________________________________________   PROCEDURES  Critical Care performed: No   Procedure(s) performed:   Procedures   ____________________________________________   INITIAL IMPRESSION / ASSESSMENT AND PLAN / ED COURSE  As part of my medical decision making, I reviewed the following data within the electronic MEDICAL RECORD NUMBER Nursing notes reviewed and incorporated, Old chart reviewed and Notes from prior ED visits    Differential diagnosis includes, but is not limited to, intracranial hemorrhage, meningitis/encephalitis, previous head trauma, cavernous venous thrombosis, tension headache, temporal arteritis, migraine or migraine equivalent, idiopathic intracranial hypertension, and non-specific headache.  However the patient is very  well-appearing, watching TV in no apparent discomfort.  He has no particular warning signs regarding his headache.  I discussed with him the various possibilities such as migraine versus normal nonspecific generalized headache.  He has no tenderness to palpation of his temples which makes temporal arteritis very unlikely.  He has no focal neurological deficits at this time.  Although he insists that he has had no history of TBI or other nonspecific head injury, his head CT shows evidence of encephalomalacia and healed calvarial fracture and I think that accounts  for a somewhat odd affect.  He is in no distress.  I offered intravenous treatment for migraine versus some oral medicine (Fioricet), and the patient is eager to get home and is hungry.  I offered the Fioricet and discharged outpatient follow-up and he agreed with that plan.  I gave my usual and customary return precautions.      ____________________________________________  FINAL CLINICAL IMPRESSION(S) / ED DIAGNOSES  Final diagnoses:  Nonintractable headache, unspecified chronicity pattern, unspecified headache type     MEDICATIONS GIVEN DURING THIS VISIT:  Medications  butalbital-acetaminophen-caffeine (FIORICET, ESGIC) 50-325-40 MG per tablet 2 tablet (2 tablets Oral Given 03/22/18 1801)     ED Discharge Orders    None       Note:  This document was prepared using Dragon voice recognition software and may include unintentional dictation errors.    Loleta Rose, MD 03/22/18 [phone]

## 2018-03-22 NOTE — Discharge Instructions (Addendum)

## 2018-03-22 NOTE — ED Triage Notes (Signed)
Says headache bilat temple areas since Sunday afternoon.  No history of headaches.  The pharmacy wrote down bp yetserday and it was 152/92.

## 2018-03-30 ENCOUNTER — Encounter: Payer: Self-pay | Admitting: *Deleted

## 2018-04-08 ENCOUNTER — Emergency Department: Payer: Medicare Other

## 2018-04-08 ENCOUNTER — Emergency Department
Admission: EM | Admit: 2018-04-08 | Discharge: 2018-04-08 | Disposition: A | Discharge status: Home / Self Care | Payer: Medicare Other | Attending: Emergency Medicine | Admitting: Emergency Medicine

## 2018-04-08 DIAGNOSIS — M7072 Other bursitis of hip, left hip: Secondary | ICD-10-CM | POA: Insufficient documentation

## 2018-04-08 DIAGNOSIS — I10 Essential (primary) hypertension: Secondary | ICD-10-CM | POA: Diagnosis not present

## 2018-04-08 DIAGNOSIS — Z79899 Other long term (current) drug therapy: Secondary | ICD-10-CM | POA: Insufficient documentation

## 2018-04-08 DIAGNOSIS — M25552 Pain in left hip: Secondary | ICD-10-CM

## 2018-04-08 DIAGNOSIS — F17210 Nicotine dependence, cigarettes, uncomplicated: Secondary | ICD-10-CM | POA: Diagnosis not present

## 2018-04-08 DIAGNOSIS — Y939 Activity, unspecified: Secondary | ICD-10-CM | POA: Insufficient documentation

## 2018-04-08 HISTORY — DX: Gastro-esophageal reflux disease without esophagitis: K21.9

## 2018-04-08 MED ORDER — IBUPROFEN 800 MG PO TABS
800.0000 mg | ORAL_TABLET | Freq: Once | ORAL | Status: AC
  Administered 2018-04-08: 800 mg via ORAL
  Filled 2018-04-08: qty 1

## 2018-04-08 MED ORDER — IBUPROFEN 800 MG PO TABS: 800.0000 mg | ORAL_TABLET | Freq: Three times a day (TID) | ORAL | 0 refills | Status: DC | PRN

## 2018-04-08 NOTE — ED Provider Notes (Signed)
[facility] Emergency Department Provider Note   First MD Initiated Contact with Patient 04/08/18 [phone]     (approximate)  I have reviewed the triage vital signs and the nursing notes.   HISTORY  Chief Complaint Hip Pain    HPI Trevor Harmon is a 52 y.o. male with below list of chronic medical conditions presents to the emergency department with nontraumatic left hip pain x1 week.  Patient states the pain is worse with ambulation or lying down.  [facility] at [facility] without any diagnosis given per the patient.    Past Medical History:  Diagnosis Date  . GERD (gastroesophageal reflux disease)   . Hypertension     There are no active problems to display for this patient.   Past Surgical History:  Procedure Laterality Date  . OTHER SURGICAL HISTORY     stent kidney    Prior to Admission medications   Medication Sig Start Date End Date Taking? Authorizing Provider  ARIPiprazole (ABILIFY) 10 MG tablet Take 10 mg by mouth daily.    [provider]  baclofen (LIORESAL) 10 MG tablet Take 1 tablet (10 mg total) by mouth 3 (three) times daily. 04/04/16   Beers, Charmayne Sheer, PA-C  Fluticasone-Salmeterol (ADVAIR) 250-50 MCG/DOSE AEPB Inhale 1 puff into the lungs 2 (two) times daily.    [provider]  ibuprofen (ADVIL,MOTRIN) 800 MG tablet Take 1 tablet (800 mg total) by mouth every 8 (eight) hours as needed. 04/04/16   Beers, Charmayne Sheer, PA-C  potassium chloride (K-DUR) 10 MEQ tablet Take 10 mEq by mouth daily.    [provider]  pravastatin (PRAVACHOL) 40 MG tablet Take 40 mg by mouth daily.    [provider]  propranolol (INDERAL) 20 MG tablet Take 20 mg by mouth 3 (three) times daily.    [provider]  ranitidine (ZANTAC) 150 MG tablet Take 150 mg by mouth 2 (two) times daily.    [provider]  traZODone (DESYREL) 100 MG tablet Take 100 mg by mouth at bedtime.    [provider]  triamterene-hydrochlorothiazide (DYAZIDE) 37.5-25 MG per capsule Take 1 capsule by mouth daily.    [provider]  Vitamin D, Ergocalciferol, (DRISDOL) 50000 UNITS CAPS capsule Take 50,000 Units by mouth every 7 (seven) days.    [provider]    Allergies No known drug allergies No family history on file.  Social History Social History   Tobacco Use  . Smoking status: Current Every Day Smoker    Packs/day: 0.50    Types: Cigarettes  . Smokeless tobacco: Never Used  Substance Use Topics  . Alcohol use: No  . Drug use: No    Review of Systems Constitutional: No fever/chills Eyes: No visual changes. ENT: No sore throat. Cardiovascular: Denies chest pain. Respiratory: Denies shortness of breath. Gastrointestinal: No abdominal pain.  No nausea, no vomiting.  No diarrhea.  No constipation. Genitourinary: Negative for dysuria. Musculoskeletal: Negative for neck pain.  Negative for back pain.  Positive for left hip pain Integumentary: Negative for rash. Neurological: Negative for headaches, focal weakness or numbness.   ____________________________________________   PHYSICAL EXAM:  VITAL SIGNS: ED Triage Vitals [04/08/18 0213]  Enc Vitals Group     BP (!) 142/74     Pulse Rate 72     Resp 17     Temp (!) 97.5 F (36.4 C)     Temp Source Oral  SpO2 96 %     Weight 90.7 kg (200 lb)     Height      Head Circumference      Peak Flow      Pain Score 0     Pain Loc      Pain Edu?      Excl. in GC?     Constitutional: Alert and oriented. Well appearing and in no acute distress. Eyes: Conjunctivae are normal. Head: Atraumatic. Mouth/Throat: Mucous membranes are moist.  Oropharynx non-erythematous. Neck: No stridor.   Cardiovascular: Normal rate, regular rhythm. Good peripheral circulation. Grossly normal heart sounds. Respiratory: Normal respiratory effort.  No retractions. Lungs CTAB. Gastrointestinal: Soft and nontender. No distention.    Musculoskeletal: Pain with palpation and abduction of the left hip Neurologic:  Normal speech and language. No gross focal neurologic deficits are appreciated.  Skin:  Skin is warm, dry and intact. No rash noted. Psychiatric: Mood and affect are normal. Speech and behavior are normal.   RADIOLOGY I, Arthur N BROWN, personally viewed and evaluated these images (plain radiographs) as part of my medical decision making, as well as reviewing the written report by the radiologist.   ED MD interpretation: No acute fracture dislocation or arthropathy noted on left hip x-ray per radiologist.  Official radiology report(s): Dg Hip Unilat With Pelvis 2-3 Views Left  Result Date: 04/08/2018 CLINICAL DATA:  52 y/o  M; one week of left hip pain. EXAM: DG HIP (WITH OR WITHOUT PELVIS) 2-3V LEFT COMPARISON:  None. FINDINGS: There is no evidence of hip fracture or dislocation. There is no evidence of arthropathy or other focal bone abnormality. IMPRESSION: Negative. Electronically Signed   By: Mitzi HansenLance  Furusawa-Stratton M.D.   On: 04/08/2018 02:44     Procedures   ____________________________________________   INITIAL IMPRESSION / ASSESSMENT AND PLAN / ED COURSE  As part of my medical decision making, I reviewed the following data within the electronic MEDICAL RECORD NUMBER  52 year old male presented with above-stated history and physical exam secondary to left hip pain.  Consider the possibility of fracture dislocation however I suspect this to be unlikely x-ray likewise negative.  Consider the possibility of septic joint however no warmth or erythema noted.  Patient states that needle aspiration was done of the [facility] at [facility] which was negative.  I suspect the patient's symptoms to be secondary to bursitis and as such patient will be given ibuprofen here in the emergency department prescribed the same for home.  He will be referred to orthopedic  surgery ____________________________________________  FINAL CLINICAL IMPRESSION(S) / ED DIAGNOSES  Final diagnoses:  Left hip pain  Bursitis of left hip, unspecified bursa     MEDICATIONS GIVEN DURING THIS VISIT:  Medications - No data to display   ED Discharge Orders    None       Note:  This document was prepared using Dragon voice recognition software and may include unintentional dictation errors.    Darci CurrentBrown, [place] N, MD 04/08/18 [phone]

## 2018-04-08 NOTE — ED Triage Notes (Addendum)
Patient coming [facility] from home for left hip pain X 1 week. Patient evaluated at [facility] for same with no diagnosed reason for pain per patient. Patient reports pain occurs while lying down. Patient reports pain also worsens with ambulation. Patient denies pain while sitting.

## 2019-08-17 ENCOUNTER — Encounter: Payer: Self-pay | Admitting: Emergency Medicine

## 2019-08-17 ENCOUNTER — Other Ambulatory Visit: Payer: Self-pay

## 2019-08-17 ENCOUNTER — Emergency Department
Admission: EM | Admit: 2019-08-17 | Discharge: 2019-08-17 | Disposition: A | Discharge status: Home / Self Care | Payer: Medicare Other | Attending: Emergency Medicine | Admitting: Emergency Medicine

## 2019-08-17 DIAGNOSIS — I10 Essential (primary) hypertension: Secondary | ICD-10-CM | POA: Insufficient documentation

## 2019-08-17 DIAGNOSIS — B349 Viral infection, unspecified: Secondary | ICD-10-CM

## 2019-08-17 DIAGNOSIS — R519 Headache, unspecified: Secondary | ICD-10-CM | POA: Insufficient documentation

## 2019-08-17 DIAGNOSIS — R509 Fever, unspecified: Secondary | ICD-10-CM | POA: Insufficient documentation

## 2019-08-17 DIAGNOSIS — Z87891 Personal history of nicotine dependence: Secondary | ICD-10-CM | POA: Diagnosis not present

## 2019-08-17 DIAGNOSIS — Z20828 Contact with and (suspected) exposure to other viral communicable diseases: Secondary | ICD-10-CM | POA: Insufficient documentation

## 2019-08-17 NOTE — Discharge Instructions (Signed)
Advised self quarantine pending results of COVID-19.

## 2019-08-17 NOTE — ED Triage Notes (Signed)
His doctor sent him here for covid test and eval because he has fever and feels bad for 8 days.  He has headache that is bothering him most.  Denies any recent tick bites.

## 2019-08-17 NOTE — ED Notes (Signed)
See triage note  Presents with headache for about 1 week.  States pain is mainly at bilateral temporal and frontal areas  No fever

## 2019-08-17 NOTE — ED Provider Notes (Signed)
[facility] Emergency Department Provider Note   ____________________________________________   First MD Initiated Contact with Patient 08/17/19 1421     (approximate)  I have reviewed the triage vital signs and the nursing notes.   HISTORY  Chief Complaint Headache and Fever    HPI Trevor Harmon is a 53 y.o. male patient sent by PCP to ED for COVID-19 test.  Patient states the past 8 days he has had intermittent fever and headache.  Patient also complained of fatigue.  Patient denies recent travel or known contact COVID-19.  Patient denies URI signs and symptoms.  Patient rates his pain/discomfort as a 2/10.  No palliative measure for complaint.         Past Medical History:  Diagnosis Date  . GERD (gastroesophageal reflux disease)   . Hypertension     There are no active problems to display for this patient.   Past Surgical History:  Procedure Laterality Date  . kidney stent    . OTHER SURGICAL HISTORY     stent kidney    Prior to Admission medications   Medication Sig Start Date End Date Taking? Authorizing Provider  ARIPiprazole (ABILIFY) 10 MG tablet Take 10 mg by mouth daily.    [provider]  baclofen (LIORESAL) 10 MG tablet Take 1 tablet (10 mg total) by mouth 3 (three) times daily. 04/04/16   Beers, Charmayne Sheer, PA-C  Fluticasone-Salmeterol (ADVAIR) 250-50 MCG/DOSE AEPB Inhale 1 puff into the lungs 2 (two) times daily.    [provider]  ibuprofen (ADVIL,MOTRIN) 800 MG tablet Take 1 tablet (800 mg total) by mouth every 8 (eight) hours as needed. 04/04/16   Beers, Charmayne Sheer, PA-C  ibuprofen (ADVIL,MOTRIN) 800 MG tablet Take 1 tablet (800 mg total) by mouth every 8 (eight) hours as needed. 04/08/18   Darci Current, MD  potassium chloride (K-DUR) 10 MEQ tablet Take 10 mEq by mouth daily.    [provider]  pravastatin (PRAVACHOL) 40 MG tablet Take 40 mg by mouth daily.    [provider]   propranolol (INDERAL) 20 MG tablet Take 20 mg by mouth 3 (three) times daily.    [provider]  ranitidine (ZANTAC) 150 MG tablet Take 150 mg by mouth 2 (two) times daily.    [provider]  traZODone (DESYREL) 100 MG tablet Take 100 mg by mouth at bedtime.    [provider]  triamterene-hydrochlorothiazide (DYAZIDE) 37.5-25 MG per capsule Take 1 capsule by mouth daily.    [provider]  Vitamin D, Ergocalciferol, (DRISDOL) 50000 UNITS CAPS capsule Take 50,000 Units by mouth every 7 (seven) days.    [provider]    Allergies Patient has no known allergies.  No family history on file.  Social History Social History   Tobacco Use  . Smoking status: Former Smoker    Packs/day: 0.50    Types: Cigarettes  . Smokeless tobacco: Never Used  Substance Use Topics  . Alcohol use: No  . Drug use: No    Review of Systems  Constitutional: No fever/chills Eyes: No visual changes. ENT: No sore throat. Cardiovascular: Denies chest pain. Respiratory: Denies shortness of breath. Gastrointestinal: No abdominal pain.  No nausea, no vomiting.  No diarrhea.  No constipation. Genitourinary: Negative for dysuria. Musculoskeletal: Negative for back pain. Skin: Negative for rash. Neurological: Positive for headaches, but denies focal weakness or numbness. Endocrine:  Hypertension   ____________________________________________   PHYSICAL EXAM:  VITAL SIGNS:  ED Triage Vitals  Enc Vitals Group     BP 08/17/19 1334 (!) 142/80     Pulse Rate 08/17/19 1334 (!) 53     Resp 08/17/19 1334 14     Temp 08/17/19 1334 97.9 F (36.6 C)     Temp Source 08/17/19 1334 Oral     SpO2 08/17/19 1334 97 %     Weight 08/17/19 1335 190 lb (86.2 kg)     Height 08/17/19 1335 6' (1.829 m)     Head Circumference --      Peak Flow --      Pain Score 08/17/19 1335 2     Pain Loc --      Pain Edu? --      Excl. in [place]? --     Constitutional: Alert and  oriented. Well appearing and in no acute distress. Nose: No congestion/rhinnorhea. Mouth/Throat: Mucous membranes are moist.  Oropharynx non-erythematous. Neck: No stridor.   Hematological/Lymphatic/Immunilogical: No cervical lymphadenopathy. Cardiovascular: Normal rate, regular rhythm. Grossly normal heart sounds.  Good peripheral circulation. Respiratory: Normal respiratory effort.  No retractions. Lungs CTAB. Gastrointestinal: Soft and nontender. No distention. No abdominal bruits. No CVA tenderness. Musculoskeletal: No lower extremity tenderness nor edema.  No joint effusions. Neurologic:  Normal speech and language. No gross focal neurologic deficits are appreciated. No gait instability. Skin:  Skin is warm, dry and intact. No rash noted. Psychiatric: Mood and affect are normal. Speech and behavior are normal.  ____________________________________________   LABS (all labs ordered are listed, but only abnormal results are displayed)  Labs Reviewed  NOVEL CORONAVIRUS, NAA (HOSP ORDER, SEND-OUT TO REF LAB; TAT 18-24 HRS)   ____________________________________________  EKG   ____________________________________________  RADIOLOGY  ED MD interpretation:    Official radiology report(s): No results found.  ____________________________________________   PROCEDURES  Procedure(s) performed (including Critical Care):  Procedures   ____________________________________________   INITIAL IMPRESSION / ASSESSMENT AND PLAN / ED COURSE  As part of my medical decision making, I reviewed the following data within the [place]         Patient presents with presents with intimating fever and headaches.  Physical exam was grossly unremarkable.  Patient advised self quarantine pending test results.      ____________________________________________   FINAL CLINICAL IMPRESSION(S) / ED DIAGNOSES  Final diagnoses:  Viral illness     ED Discharge  Orders    None       Note:  This document was prepared using Dragon voice recognition software and may include unintentional dictation errors.    Sable Feil, PA-C 08/17/19 1427    Nena Polio, MD 08/17/19 [phone]

## 2019-08-18 LAB — NOVEL CORONAVIRUS, NAA (HOSP ORDER, SEND-OUT TO REF LAB; TAT 18-24 HRS): SARS-CoV-2, NAA: NOT DETECTED

## 2020-04-04 ENCOUNTER — Other Ambulatory Visit: Payer: Self-pay | Admitting: Internal Medicine

## 2020-05-12 IMAGING — CT CT HEAD W/O CM
3 series · 15 of 47 positions shown, 18 images · non-contrast
Comparison: CT HEAD August 25, 2012

CLINICAL DATA: Severe headache for 2 days. History of hypertension
and subdural hematoma.

EXAM:
CT HEAD WITHOUT CONTRAST
TECHNIQUE: Contiguous axial images were obtained from the base of the skull
through the vertex without intravenous contrast.

[Series 2: head wo · axial · 0.44mm/px · z∈[-581,-456]mm · 9 of 30 slices shown, 12 images]
[im 3/30  brain]
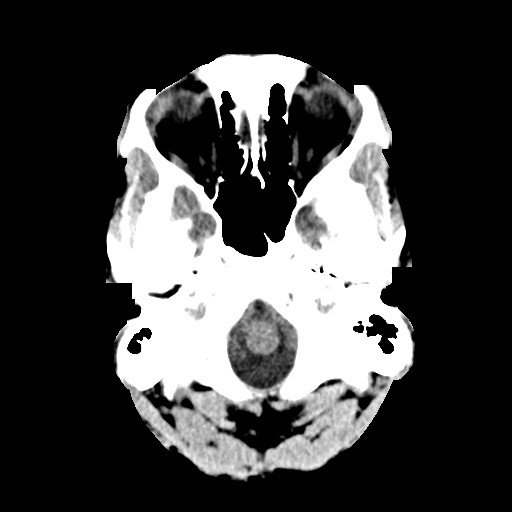
[im 3/30  bone]
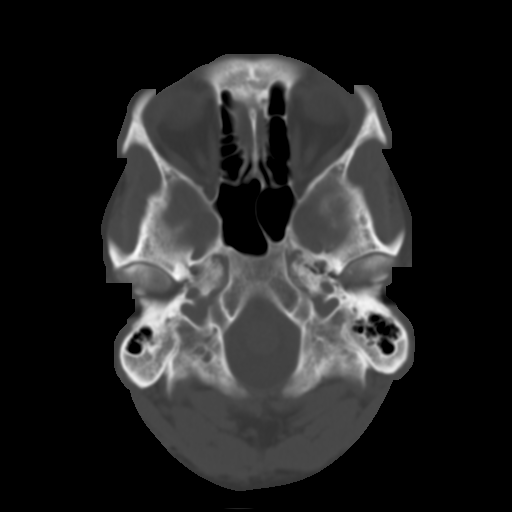
[im 6/30  brain]
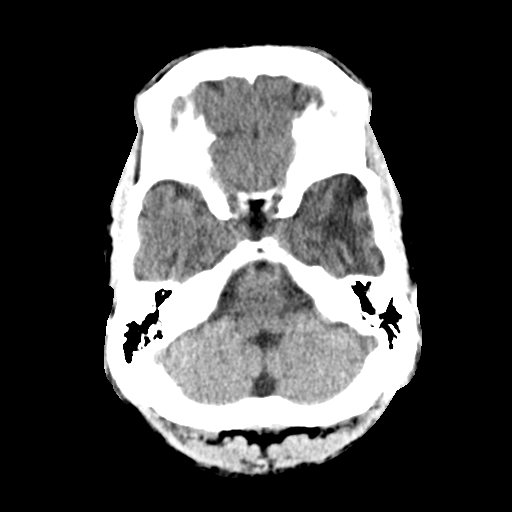
[im 9/30  brain]
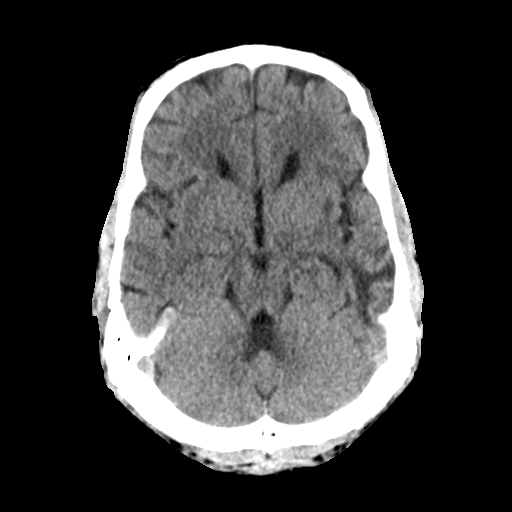
[im 12/30  brain]
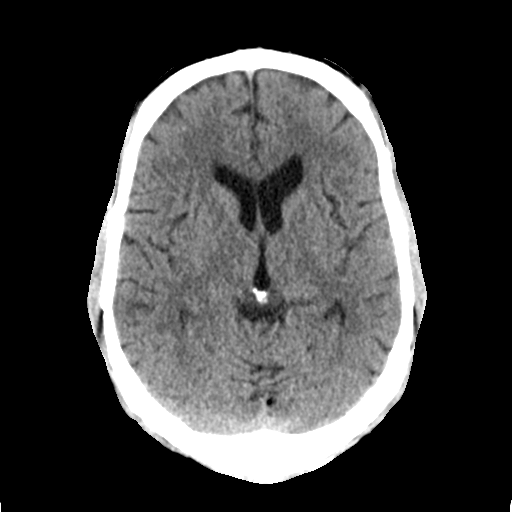
[im 16/30  brain]
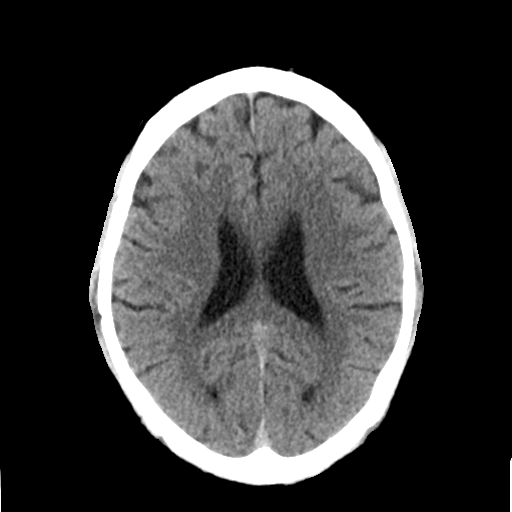
[im 16/30  bone]
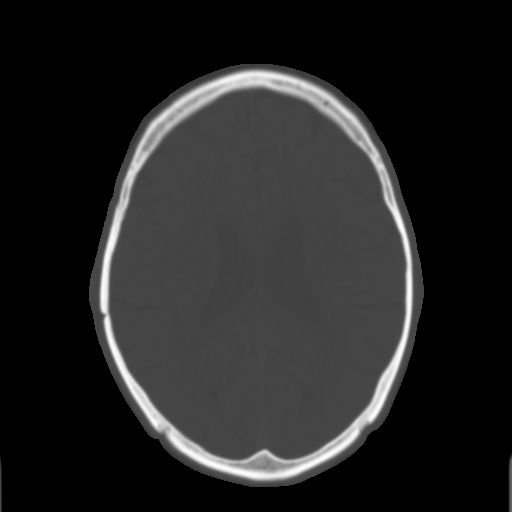
[im 19/30  brain]
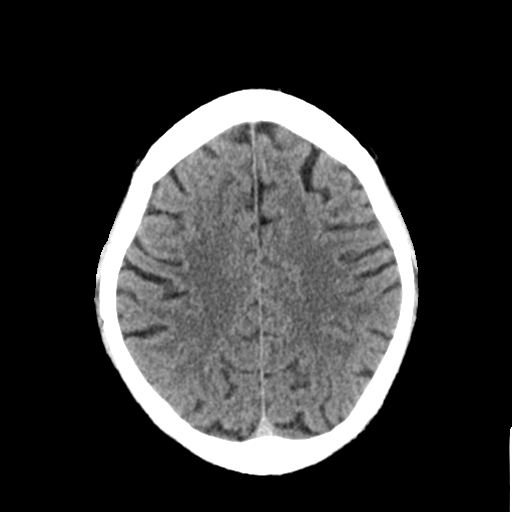
[im 22/30  brain]
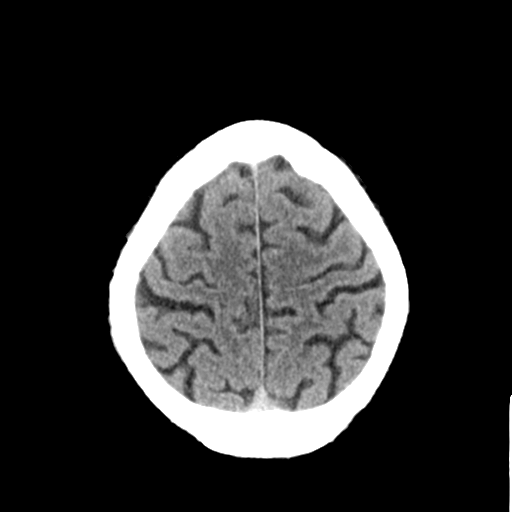
[im 25/30  brain]
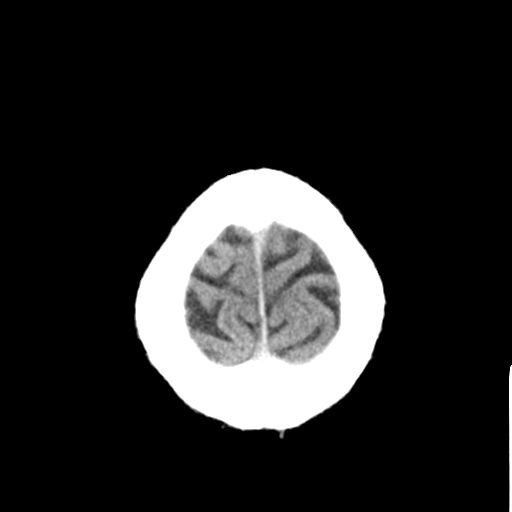
[im 28/30  brain]
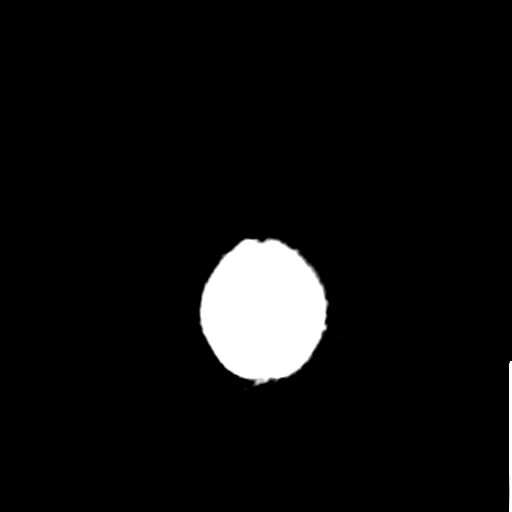
[im 28/30  bone]
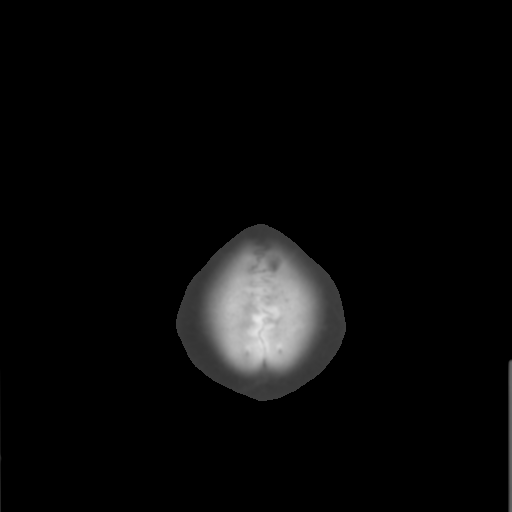

[Series 4: coronal soft tissue · coronal · 0.31mm/px · 3 of 64 slices shown]
[im 22/64  brain]
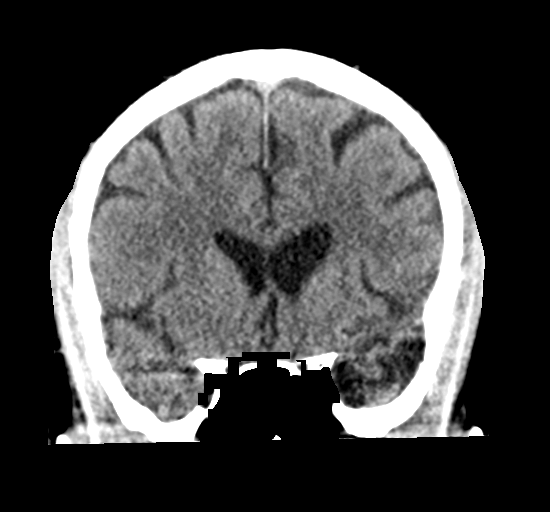
[im 29/64  brain]
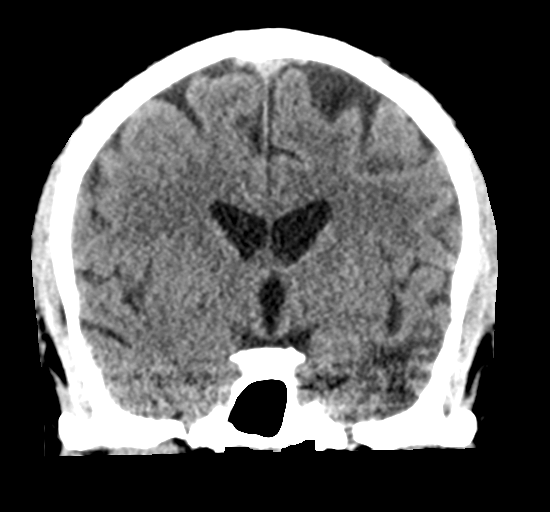
[im 36/64  brain]
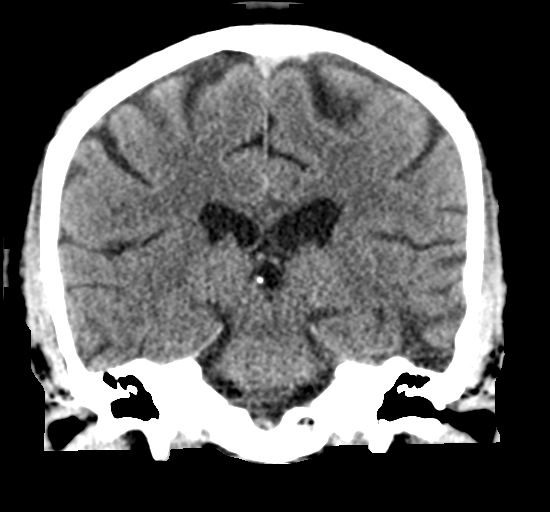

[Series 5: sagittal soft tissue · sagittal · 0.32mm/px · 3 of 52 slices shown]
[im 18/52  brain]
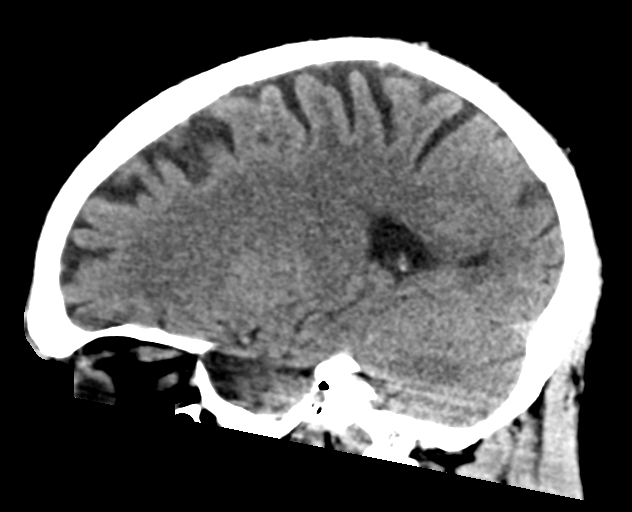
[im 26/52  brain]
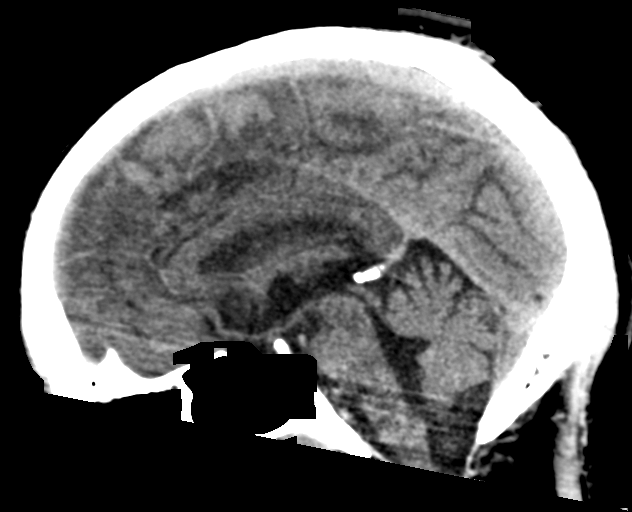
[im 35/52  brain]
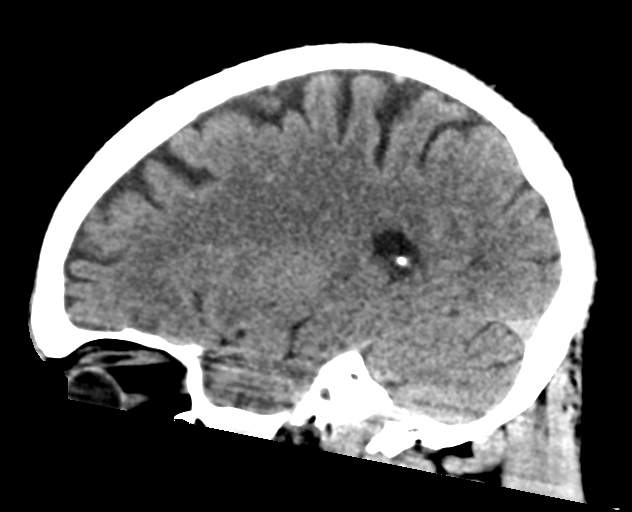

[15 of 47 positions shown; findings below may reference images not displayed]

FINDINGS: BRAIN: No intraparenchymal hemorrhage, mass effect nor midline
shift. LEFT frontotemporal encephalomalacia. Mild parenchymal brain
volume loss for age. No hydrocephalus. Patchy LEFT parietal white
matter hypodensities suggesting small vessel ischemic changes. No
acute large vascular territory infarcts. No abnormal extra-axial
fluid collections. Basal cisterns are patent.

VASCULAR: Unremarkable.

SKULL/SOFT TISSUES: No skull fracture. Healed RIGHT frontotemporal
calvarial fracture. No significant soft tissue swelling.

ORBITS/SINUSES: The included ocular globes and orbital contents are
normal.The mastoid aircells and included paranasal sinuses are
well-aerated.

OTHER: None.
IMPRESSION: 1. No acute intracranial process.
2. LEFT frontotemporal encephalomalacia consistent with TBI.
3. Mild parenchymal brain volume loss. Mild chronic small vessel
ischemic changes.

## 2020-05-29 IMAGING — CR DG HIP (WITH OR WITHOUT PELVIS) 2-3V*L*
3 series · 3 of 3 positions shown · non-contrast
Comparison: None.

CLINICAL DATA: 52 y/o  M; one week of left hip pain.

EXAM:
DG HIP (WITH OR WITHOUT PELVIS) 2-3V LEFT

[pelvis ap]
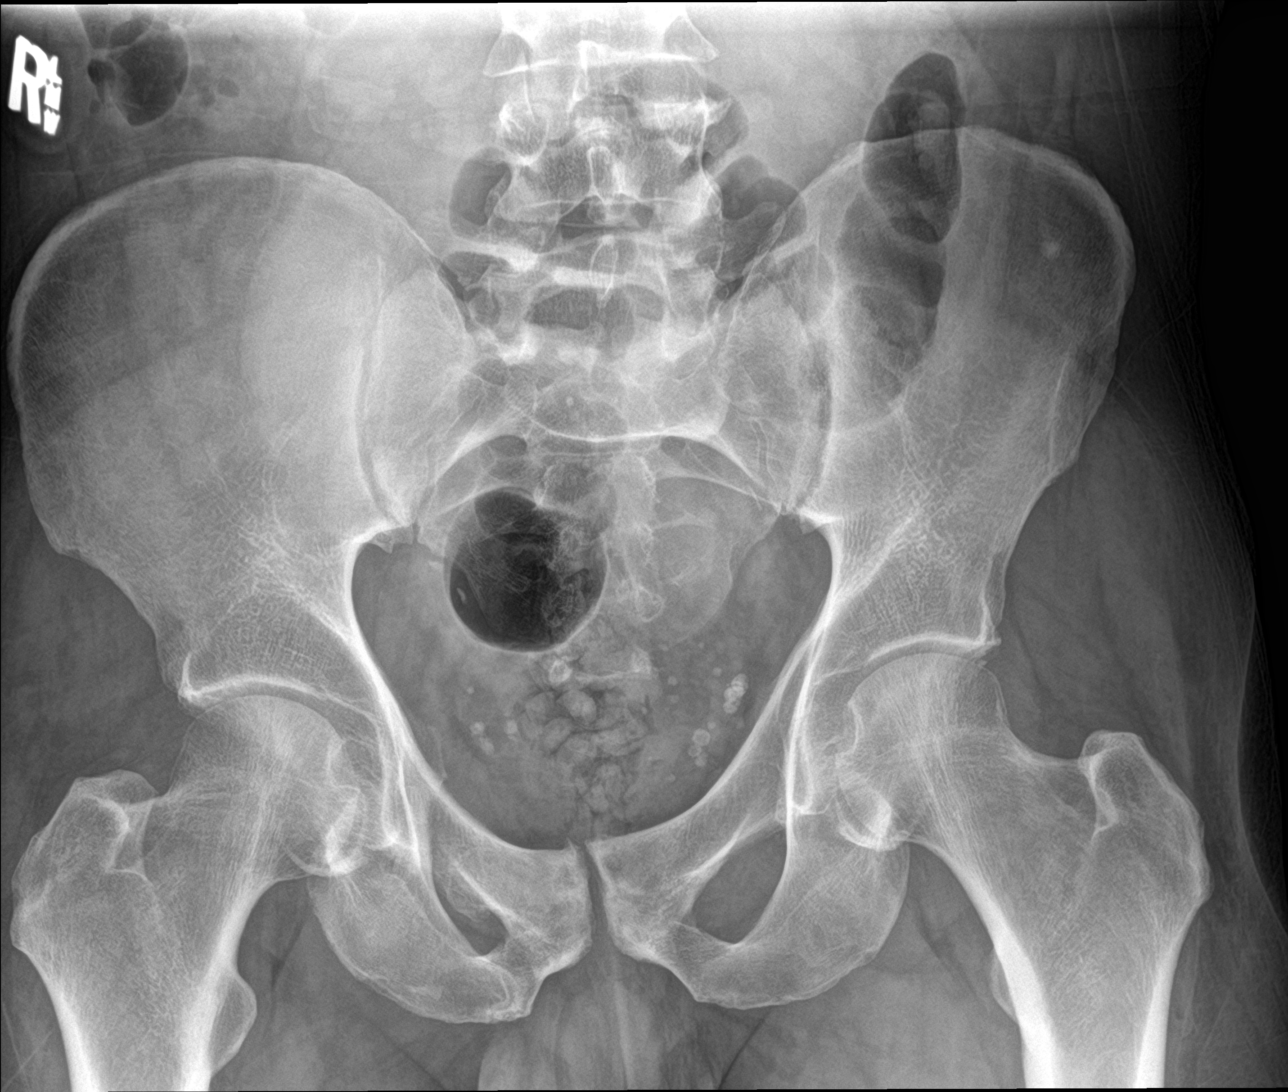

[hip ap]
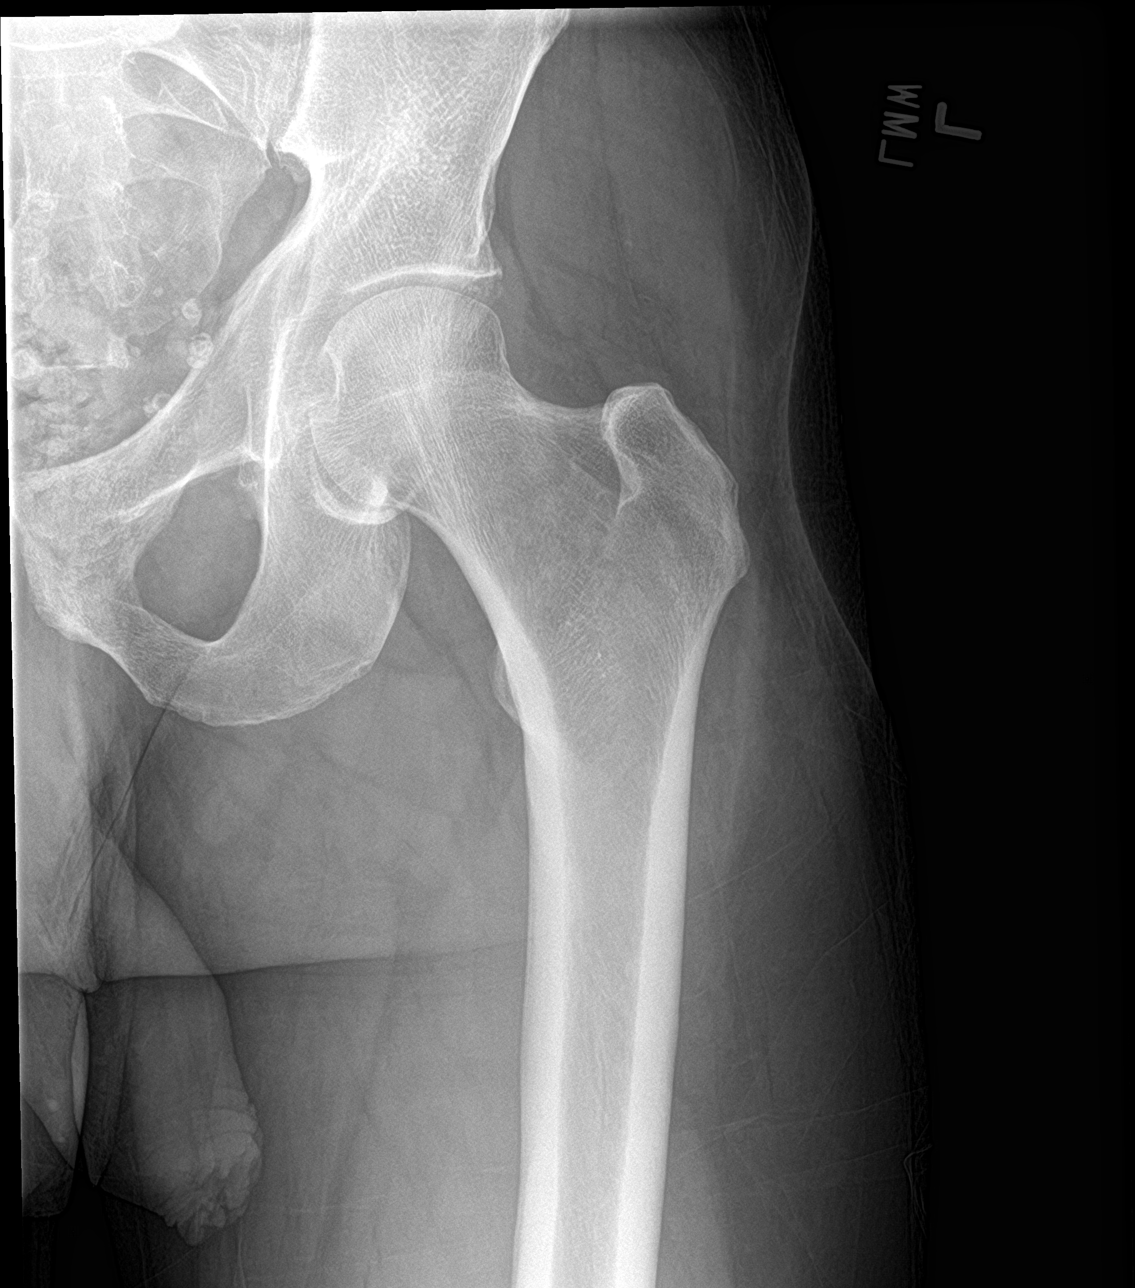

[hip lat]
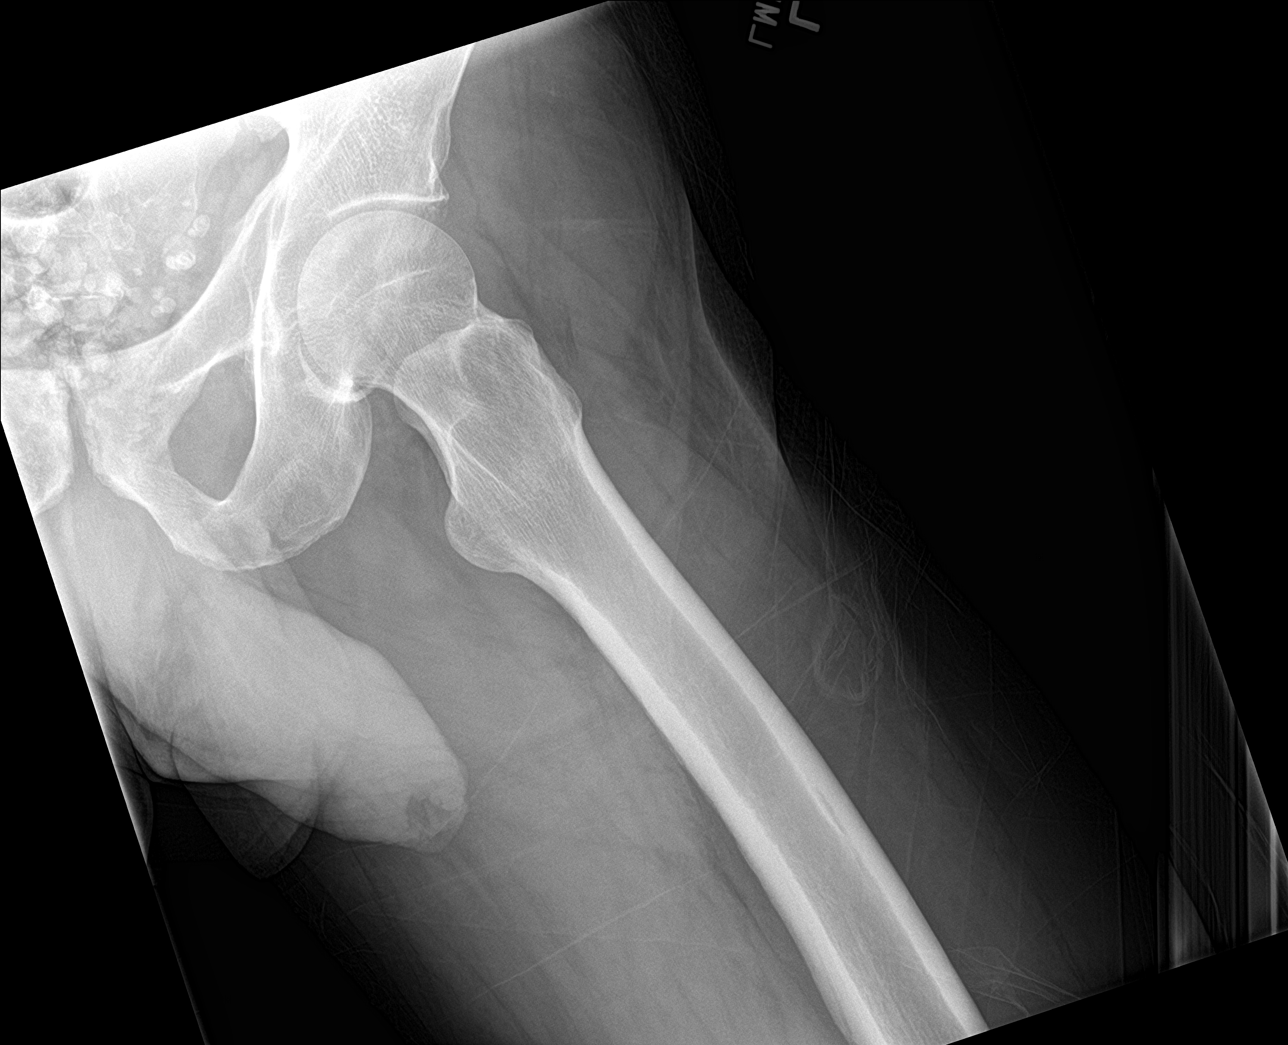

[3 of 3 positions shown; findings below may reference images not displayed]

FINDINGS: There is no evidence of hip fracture or dislocation. There is no
evidence of arthropathy or other focal bone abnormality.
IMPRESSION: Negative.

By: Jajaj Nikolas M.D.

## 2020-06-11 ENCOUNTER — Other Ambulatory Visit: Payer: Self-pay | Admitting: Internal Medicine

## 2020-07-06 ENCOUNTER — Encounter: Payer: Self-pay | Admitting: Emergency Medicine

## 2020-07-06 ENCOUNTER — Emergency Department
Admission: EM | Admit: 2020-07-06 | Discharge: 2020-07-06 | Disposition: A | Discharge status: Home / Self Care | Payer: Medicare Other | Attending: Emergency Medicine | Admitting: Emergency Medicine

## 2020-07-06 ENCOUNTER — Other Ambulatory Visit: Payer: Self-pay

## 2020-07-06 DIAGNOSIS — K0889 Other specified disorders of teeth and supporting structures: Secondary | ICD-10-CM | POA: Diagnosis not present

## 2020-07-06 DIAGNOSIS — R519 Headache, unspecified: Secondary | ICD-10-CM | POA: Diagnosis present

## 2020-07-06 DIAGNOSIS — I10 Essential (primary) hypertension: Secondary | ICD-10-CM | POA: Insufficient documentation

## 2020-07-06 DIAGNOSIS — R6884 Jaw pain: Secondary | ICD-10-CM | POA: Diagnosis not present

## 2020-07-06 DIAGNOSIS — K029 Dental caries, unspecified: Secondary | ICD-10-CM

## 2020-07-06 DIAGNOSIS — Z79899 Other long term (current) drug therapy: Secondary | ICD-10-CM | POA: Insufficient documentation

## 2020-07-06 DIAGNOSIS — F17210 Nicotine dependence, cigarettes, uncomplicated: Secondary | ICD-10-CM | POA: Insufficient documentation

## 2020-07-06 MED ORDER — AMOXICILLIN 500 MG PO CAPS
500.0000 mg | ORAL_CAPSULE | Freq: Once | ORAL | Status: AC
  Administered 2020-07-06: 500 mg via ORAL
  Filled 2020-07-06: qty 1

## 2020-07-06 MED ORDER — IBUPROFEN 800 MG PO TABS: 800.0000 mg | ORAL_TABLET | Freq: Three times a day (TID) | ORAL | 0 refills | Status: DC | PRN

## 2020-07-06 MED ORDER — AMOXICILLIN 500 MG PO CAPS: 500.0000 mg | ORAL_CAPSULE | Freq: Three times a day (TID) | ORAL | 0 refills | Status: DC

## 2020-07-06 MED ORDER — ACETAMINOPHEN 325 MG PO TABS
650.0000 mg | ORAL_TABLET | Freq: Once | ORAL | Status: AC
  Administered 2020-07-06: 650 mg via ORAL
  Filled 2020-07-06: qty 2

## 2020-07-06 NOTE — ED Notes (Signed)
Pt reports left sided ear and head pain that started 2 days ago; rates the pain 10/10.

## 2020-07-06 NOTE — Discharge Instructions (Signed)
Your exam is normal at this time. You are being treated for dental pain and headache due to dental caries. Take the antibiotic as directed and the pain medicine as needed. Follow-up with your provider for continued care. Return as needed.

## 2020-07-06 NOTE — ED Triage Notes (Signed)
Pt arrived via [facility] with reports of L side headache for 2 days that radiates to left ear, pt states it is painful for him to open his mouth all of the way.  Pt has not taken any pain meds.

## 2020-07-07 NOTE — ED Provider Notes (Signed)
[facility] Emergency Department Provider Note ____________________________________________  Time seen: 1615  I have reviewed the triage vital signs and the nursing notes.  HISTORY  Chief Complaint  Headache and Otalgia  HPI Trevor Harmon is a 54 y.o. male presents himself to the ED from home via EMS, for evaluation of left-sided headache.  Patient describes 2 days of pain to the left upper jaw and temple region.  He denies any fever, chills, or sweats.  Also denies any light sensitivity, nausea, vomiting, or dizziness.  Patient localizes pain to the upper jaw, and reports pain is increased with chewing and biting.  He denies any sore throat or difficulty controlling oral secretions.   No medications have been taken for symptom relief.  Past Medical History:  Diagnosis Date  . GERD (gastroesophageal reflux disease)   . Hypertension     There are no problems to display for this patient.   Past Surgical History:  Procedure Laterality Date  . kidney stent    . OTHER SURGICAL HISTORY     stent kidney    Prior to Admission medications   Medication Sig Start Date End Date Taking? Authorizing Provider  amoxicillin (AMOXIL) 500 MG capsule Take 1 capsule (500 mg total) by mouth 3 (three) times daily. 07/06/20   Kentarius Partington, Charlesetta Ivory, PA-C  ARIPiprazole (ABILIFY) 10 MG tablet Take 10 mg by mouth daily.    [provider]  Fluticasone-Salmeterol (ADVAIR) 250-50 MCG/DOSE AEPB Inhale 1 puff into the lungs 2 (two) times daily.    [provider]  ibuprofen (ADVIL) 800 MG tablet Take 1 tablet (800 mg total) by mouth every 8 (eight) hours as needed. 07/06/20   Ana Liaw, Charlesetta Ivory, PA-C  potassium chloride (K-DUR) 10 MEQ tablet Take 10 mEq by mouth daily.    [provider]  pravastatin (PRAVACHOL) 40 MG tablet TAKE 1 TABLET BY MOUTH DAILY 04/04/20   Corky Downs, MD  propranolol (INDERAL) 20 MG tablet TAKE 1 TABLET BY MOUTH 2 TIMES DAILY  06/11/20   Corky Downs, MD  ranitidine (ZANTAC) 150 MG tablet Take 150 mg by mouth 2 (two) times daily.    [provider]  traZODone (DESYREL) 100 MG tablet TAKE 1 TABLET BY MOUTH AT BEDTIME 06/11/20   Corky Downs, MD  triamterene-hydrochlorothiazide (DYAZIDE) 37.5-25 MG per capsule Take 1 capsule by mouth daily.    [provider]  Vitamin D, Ergocalciferol, (DRISDOL) 50000 UNITS CAPS capsule Take 50,000 Units by mouth every 7 (seven) days.    [provider]    Allergies Patient has no known allergies.  History reviewed. No pertinent family history.  Social History Social History   Tobacco Use  . Smoking status: Current Every Day Smoker    Packs/day: 1.00    Types: Cigarettes  . Smokeless tobacco: Never Used  Substance Use Topics  . Alcohol use: No  . Drug use: No    Review of Systems  Constitutional: Negative for fever. Eyes: Negative for visual changes. ENT: Negative for sore throat. Reports left jaw pain as above. Cardiovascular: Negative for chest pain. Respiratory: Negative for shortness of breath. Gastrointestinal: Negative for abdominal pain, vomiting and diarrhea. Genitourinary: Negative for dysuria. Musculoskeletal: Negative for back pain.   Skin: Negative for rash. Neurological: Negative for focal weakness or numbness.  Reports left-sided head pain as above. ____________________________________________  PHYSICAL EXAM:  VITAL SIGNS: ED Triage Vitals  Enc Vitals Group     BP 07/06/20 1603 135/88  Pulse Rate 07/06/20 1603 73     Resp 07/06/20 1603 18     Temp 07/06/20 1603 98.6 F (37 C)     Temp Source 07/06/20 1603 Oral     SpO2 07/06/20 1603 96 %     Weight 07/06/20 1604 178 lb 14.4 oz (81.1 kg)     Height 07/06/20 1604 6' (1.829 m)     Head Circumference --      Peak Flow --      Pain Score 07/06/20 1603 10     Pain Loc --      Pain Edu? --      Excl. in GC? --     Constitutional: Alert and oriented. Well  appearing and in no distress. Head: Normocephalic and atraumatic. Eyes: Conjunctivae are normal. PERRL. Normal extraocular movements Ears: Canals clear. TMs intact bilaterally. Nose: No congestion/rhinorrhea/epistaxis. Mouth/Throat: Mucous membranes are moist.  Uvula is midline and tonsils are flat.  No oropharyngeal lesions are appreciated.  Patient with pain localized to the left upper jaw.  There is no TMJ dysfunction appreciated.  No malocclusion of the mandible is appreciated.  Patient with tenderness to palpation to the left upper molars.  There is significant gingival inflammation and tartar/callus formation.  No brawny sublingual erythema is appreciated. Neck: Supple. No thyromegaly. Hematological/Lymphatic/Immunological: No cervical lymphadenopathy. Cardiovascular: Normal rate, regular rhythm. Normal distal pulses. Respiratory: Normal respiratory effort. No wheezes/rales/rhonchi. Musculoskeletal: Nontender with normal range of motion in all extremities.  Skin:  Skin is warm, dry and intact. No rash noted. Psychiatric: Mood and affect are normal. Patient exhibits appropriate insight and judgment. ____________________________________________  PROCEDURES  Amoxicillin 500 mg Ibuprofen 800 mg p.o.  Procedures ____________________________________________  INITIAL IMPRESSION / ASSESSMENT AND PLAN / ED COURSE  Patient presents to the ED for evaluation of left-sided headache and facial pain.  Patient reports pain to the left upper jaw and temple region is worse with mastication.  Exam is overall benign reassuring this time.  No fever on exam and no difficulty controlling oral secretions.  Patient is likely represent a dental etiology.  He will be treated empirically with amoxicillin and ibuprofen.  He is advised to follow-up with his primary provider return to the ED for worsening symptoms.  Trevor Harmon was evaluated in Emergency Department on 07/07/2020 for the symptoms described in the  history of present illness. He was evaluated in the context of the global COVID-19 pandemic, which necessitated consideration that the patient might be at risk for infection with the SARS-CoV-2 virus that causes COVID-19. Institutional protocols and algorithms that pertain to the evaluation of patients at risk for COVID-19 are in a state of rapid change based on information released by regulatory bodies including the CDC and federal and state organizations. These policies and algorithms were followed during the patient's care in the ED. ____________________________________________  FINAL CLINICAL IMPRESSION(S) / ED DIAGNOSES  Final diagnoses:  Pain in upper jaw  Pain due to dental caries      Lissa Hoard, PA-C 07/07/20 0008    Dionne Bucy, MD 07/07/20 1500

## 2020-07-08 ENCOUNTER — Other Ambulatory Visit: Payer: Self-pay

## 2020-07-08 ENCOUNTER — Ambulatory Visit (INDEPENDENT_AMBULATORY_CARE_PROVIDER_SITE_OTHER): Payer: Medicare Other | Admitting: Internal Medicine

## 2020-07-08 ENCOUNTER — Encounter: Payer: Self-pay | Admitting: Internal Medicine

## 2020-07-08 ENCOUNTER — Ambulatory Visit: Payer: Medicare Other | Admitting: Internal Medicine

## 2020-07-08 VITALS — BP 140/84 | HR 71 | Ht 72.0 in | Wt 179.0 lb

## 2020-07-08 DIAGNOSIS — Z716 Tobacco abuse counseling: Secondary | ICD-10-CM | POA: Diagnosis not present

## 2020-07-08 DIAGNOSIS — I10 Essential (primary) hypertension: Secondary | ICD-10-CM | POA: Diagnosis not present

## 2020-07-08 DIAGNOSIS — M26622 Arthralgia of left temporomandibular joint: Secondary | ICD-10-CM | POA: Diagnosis not present

## 2020-07-08 DIAGNOSIS — E785 Hyperlipidemia, unspecified: Secondary | ICD-10-CM | POA: Insufficient documentation

## 2020-07-08 DIAGNOSIS — F5101 Primary insomnia: Secondary | ICD-10-CM | POA: Insufficient documentation

## 2020-07-08 NOTE — Assessment & Plan Note (Signed)
Pt was seen in the ER with sudden onset of pain in the left jaw.He denies hx of fevers, chills, chest pain, or pain in other places. He denied difficulty chewing food today. Examination of the mouth revealed mild tenderness on left side. Teeth are in good health.Left tonsil area showed some exudate and was pinkish in color. Tongue is normal. We'll finish with the antibiotic and then reevaluated for other etiologies of the jaw pain if it persists. He may need an ENT evaluation as well as a dental evaluation.

## 2020-07-08 NOTE — Assessment & Plan Note (Signed)
-   The patient's dyslipidemia is stable on pravachol. - The patient will continue the current treatment regimen.  - I encouraged the patient to eat more vegetables and whole wheat, and to avoid fatty foods like whole milk, hard cheese, egg yolks, margarine, baked sweets, and fried foods.  - I encouraged the patient to live an active lifestyle and complete activities for 40 minutes at least three times per week.  - I instructed the patient to go to the ER if they begin having chest pain.

## 2020-07-08 NOTE — Assessment & Plan Note (Signed)
Pt's blood pressure is stable on inderal and dyazide. 140/84 today.

## 2020-07-08 NOTE — Assessment & Plan Note (Signed)
-   I instructed the patient to stop smoking and provided them with smoking cessation materials.  - I informed the patient that smoking puts them at increased risk for cancer, COPD, hypertension, and more.  - Informed the patient to seek help if they begin to have trouble breathing, develop chest pain, start to cough up blood, feel faint, or pass out.  

## 2020-07-08 NOTE — Assessment & Plan Note (Signed)
He is taking trazodone to treat insomina.

## 2020-07-08 NOTE — Progress Notes (Signed)
Established Patient Office Visit  SUBJECTIVE:  Subjective  Patient ID: Trevor Harmon, male    DOB: [birth year]  Age: 54 y.o. MRN: [id number]  CC:  Chief Complaint  Patient presents with  . Follow-up    patient was seen in ER for headached and jaw pain. He was given amoxicillin.     HPI Trevor Harmon is a 54 y.o. male presenting today for evaluation of jaw pain.  He notes that he woke up, went to get out of bed, and then had some excruciating jaw pain. He was in enough pain that he ended up calling EMS and went to the [facility] ED on 07/06/2020. He reported that he pain increases with chewing and biting. He was diagnosed with pain due to dental carries. He was prescribed amoxicillin 500mg  TID x10 days and ibuprofen 800 mg ever 8hrs as needed.   He continues to smoke, generally around 1 pack per day.   Past Medical History:  Diagnosis Date  . GERD (gastroesophageal reflux disease)   . Hypertension     Past Surgical History:  Procedure Laterality Date  . kidney stent    . OTHER SURGICAL HISTORY     stent kidney    History reviewed. No pertinent family history.  Social History   Socioeconomic History  . Marital status: Single    Spouse name: Not on file  . Number of children: Not on file  . Years of education: Not on file  . Highest education level: Not on file  Occupational History  . Not on file  Tobacco Use  . Smoking status: Current Every Day Smoker    Packs/day: 1.00    Types: Cigarettes  . Smokeless tobacco: Never Used  Substance and Sexual Activity  . Alcohol use: No  . Drug use: No  . Sexual activity: Not on file  Other Topics Concern  . Not on file  Social History Narrative  . Not on file   Social Determinants of Health   Financial Resource Strain:   . Difficulty of Paying Living Expenses: Not on file  Food Insecurity:   . Worried About in the Last Year: Not on file  . Ran Out of Food in the Last Year: Not on file  Transportation  Needs:   . Lack of Transportation (Medical): Not on file  . Lack of Transportation (Non-Medical): Not on file  Physical Activity:   . Days of Exercise per Week: Not on file  . Minutes of Exercise per Session: Not on file  Stress:   . Feeling of Stress : Not on file  Social Connections:   . Frequency of Communication with Friends and Family: Not on file  . Frequency of Social Gatherings with Friends and Family: Not on file  . Attends Religious Services: Not on file  . Active Member of Clubs or Organizations: Not on file  . Attends Programme researcher, broadcasting/film/video Meetings: Not on file  . Marital Status: Not on file  Intimate Partner Violence:   . Fear of Current or Ex-Partner: Not on file  . Emotionally Abused: Not on file  . Physically Abused: Not on file  . Sexually Abused: Not on file     Current Outpatient Medications:  .  amoxicillin (AMOXIL) 500 MG capsule, Take 1 capsule (500 mg total) by mouth 3 (three) times daily., Disp: 30 capsule, Rfl: 0 .  ARIPiprazole (ABILIFY) 10 MG tablet, Take 10 mg by mouth daily., Disp: , Rfl:  .  Fluticasone-Salmeterol (ADVAIR) 250-50 MCG/DOSE AEPB, Inhale 1 puff into the lungs 2 (two) times daily., Disp: , Rfl:  .  ibuprofen (ADVIL) 800 MG tablet, Take 1 tablet (800 mg total) by mouth every 8 (eight) hours as needed., Disp: 30 tablet, Rfl: 0 .  potassium chloride (K-DUR) 10 MEQ tablet, Take 10 mEq by mouth daily., Disp: , Rfl:  .  pravastatin (PRAVACHOL) 40 MG tablet, TAKE 1 TABLET BY MOUTH DAILY, Disp: 30 tablet, Rfl: 6 .  propranolol (INDERAL) 20 MG tablet, TAKE 1 TABLET BY MOUTH 2 TIMES DAILY, Disp: 60 tablet, Rfl: 3 .  ranitidine (ZANTAC) 150 MG tablet, Take 150 mg by mouth 2 (two) times daily., Disp: , Rfl:  .  traZODone (DESYREL) 100 MG tablet, TAKE 1 TABLET BY MOUTH AT BEDTIME, Disp: 30 tablet, Rfl: 3 .  triamterene-hydrochlorothiazide (DYAZIDE) 37.5-25 MG per capsule, Take 1 capsule by mouth daily., Disp: , Rfl:  .  Vitamin D, Ergocalciferol,  (DRISDOL) 50000 UNITS CAPS capsule, Take 50,000 Units by mouth every 7 (seven) days., Disp: , Rfl:    No Known Allergies  ROS Review of Systems  HENT: Positive for dental problem. Negative for ear pain, facial swelling, mouth sores, sore throat and trouble swallowing.        +Jaw pain  Respiratory: Negative for cough and shortness of breath.   Cardiovascular: Negative for chest pain and leg swelling.  Musculoskeletal: Negative for arthralgias.     OBJECTIVE:    Physical Exam Vitals reviewed.  Constitutional:      Appearance: Normal appearance.  HENT:     Mouth/Throat:     Mouth: Mucous membranes are moist. No oral lesions.     Dentition: No dental tenderness or dental abscesses.     Tongue: No lesions.     Palate: No lesions.  Eyes:     Pupils: Pupils are equal, round, and reactive to light.  Neck:     Vascular: No carotid bruit.  Cardiovascular:     Rate and Rhythm: Normal rate and regular rhythm.     Pulses: Normal pulses.     Heart sounds: Normal heart sounds.  Pulmonary:     Effort: Pulmonary effort is normal.     Breath sounds: Normal breath sounds.  Abdominal:     General: Bowel sounds are normal.     Palpations: Abdomen is soft. There is no hepatomegaly, splenomegaly or mass.     Tenderness: There is no abdominal tenderness.     Hernia: No hernia is present.  Musculoskeletal:     Cervical back: Neck supple.     Right lower leg: No edema.     Left lower leg: No edema.  Skin:    Findings: No rash.  Neurological:     Mental Status: He is alert and oriented to person, place, and time.     Motor: No weakness.  Psychiatric:        Mood and Affect: Mood normal.        Behavior: Behavior normal.     BP 140/84   Pulse 71   Ht 6' (1.829 m)   Wt 179 lb (81.2 kg)   BMI 24.28 kg/m  Wt Readings from Last 3 Encounters:  07/08/20 179 lb (81.2 kg)  07/06/20 178 lb 14.4 oz (81.1 kg)  08/17/19 190 lb (86.2 kg)    Health Maintenance Due  Topic Date Due  .  Hepatitis C Screening  Never done  . COVID-19 Vaccine (1) Never done  . HIV Screening  Never done  . TETANUS/TDAP  Never done  . COLONOSCOPY  Never done  . INFLUENZA VACCINE  06/09/2020    There are no preventive care reminders to display for this patient.  CBC Latest Ref Rng & Units 11/10/2014 03/07/2014 03/04/2014  WBC 3.8 - 10.6 x10 3/mm 3 6.2 9.9 9.7  Hemoglobin 13.0 - 18.0 g/dL 32.9 51.8 84.1  Hematocrit 40.0 - 52.0 % 41.2 44.3 44.5  Platelets 150 - 440 x10 3/mm 3 203 224 224   CMP Latest Ref Rng & Units 11/10/2014 03/07/2014 03/04/2014  Glucose 65 - 99 mg/dL 88 660(Y) 301(S)  BUN 7 - 18 mg/dL 12 12 13   Creatinine 0.60 - 1.30 mg/dL ) 0.10(X) 3.23(F)  Sodium 136 - 145 mmol/L 139 136 138  Potassium 3.5 - 5.1 mmol/L 3.8 3.8 3.5  Chloride 98 - 107 mmol/L 106 102 103  CO2 21 - 32 mmol/L 28 29 30   Calcium 8.5 - 10.1 mg/dL 8.3(L) 8.9 9.3  Total Protein 6.4 - 8.2 g/dL - 7.7 7.9  Total Bilirubin 0.2 - 1.0 mg/dL - 0.3 0.4  Alkaline Phos Unit/L - 93 97  AST 15 - 37 Unit/L - 42(H) 38(H)  ALT 12 - 78 U/L - 29 29    No results found for: TSH Lab Results  Component Value Date   ALBUMIN 4.0 03/07/2014   ANIONGAP 5 (L) 11/10/2014   No results found for: CHOL, HDL, LDLCALC, CHOLHDL No results found for: TRIG No results found for: HGBA1C    ASSESSMENT & PLAN:   Problem List Items Addressed This Visit      Cardiovascular and Mediastinum   Essential hypertension    Pt's blood pressure is stable on inderal and dyazide. 140/84 today.         Musculoskeletal and Integument   Arthralgia of left temporomandibular joint - Primary    Pt was seen in the ER with sudden onset of pain in the left jaw.He denies hx of fevers, chills, chest pain, or pain in other places. He denied difficulty chewing food today. Examination of the mouth revealed mild tenderness on left side. Teeth are in good health.Left tonsil area showed some exudate and was pinkish in color. Tongue is normal. We'll finish  with the antibiotic and then reevaluated for other etiologies of the jaw pain if it persists. He may need an ENT evaluation as well as a dental evaluation.         Other   Dyslipidemia (high LDL; low HDL)    - The patient's dyslipidemia is stable on pravachol. - The patient will continue the current treatment regimen.  - I encouraged the patient to eat more vegetables and whole wheat, and to avoid fatty foods like whole milk, hard cheese, egg yolks, margarine, baked sweets, and fried foods.  - I encouraged the patient to live an active lifestyle and complete activities for 40 minutes at least three times per week.  - I instructed the patient to go to the ER if they begin having chest pain.        Tobacco abuse counseling    .- I instructed the patient to stop smoking and provided them with smoking cessation materials.  - I informed the patient that smoking puts them at increased risk for cancer, COPD, hypertension, and more.  - Informed the patient to seek help if they begin to have trouble breathing, develop chest pain, start to cough up blood, feel faint, or pass out.  Primary insomnia    He is taking trazodone to treat insomina.         No orders of the defined types were placed in this encounter.   Follow-up: Return in about 10 days (around 07/18/2020), or if symptoms worsen or fail to improve.    Dr. Woodroe ChenJaved Amorette Charrette [facility] [address], [place], [place] [id number]   By signing my name below, I, [employer], attest that this documentation has been prepared under the direction and in the presence of Corky DownsMasoud, Lainy Wrobleski, MD. Electronically Signed: Corky DownsJaved Claron Rosencrans, MD 07/08/20, 2:50 PM   I personally performed the services described in this documentation, which was SCRIBED in my presence. The recorded information has been reviewed and considered accurate. It has been edited as necessary during review. Corky DownsJaved Clifton Safley, MD

## 2020-07-22 ENCOUNTER — Ambulatory Visit (INDEPENDENT_AMBULATORY_CARE_PROVIDER_SITE_OTHER): Payer: Medicare Other | Admitting: Internal Medicine

## 2020-07-22 ENCOUNTER — Other Ambulatory Visit: Payer: Self-pay

## 2020-07-22 ENCOUNTER — Ambulatory Visit: Payer: Medicare Other | Admitting: Internal Medicine

## 2020-07-22 ENCOUNTER — Encounter: Payer: Self-pay | Admitting: Internal Medicine

## 2020-07-22 VITALS — BP 147/99 | HR 55 | Ht 72.0 in | Wt 187.1 lb

## 2020-07-22 DIAGNOSIS — F5101 Primary insomnia: Secondary | ICD-10-CM | POA: Diagnosis not present

## 2020-07-22 DIAGNOSIS — Z716 Tobacco abuse counseling: Secondary | ICD-10-CM

## 2020-07-22 DIAGNOSIS — I10 Essential (primary) hypertension: Secondary | ICD-10-CM

## 2020-07-22 DIAGNOSIS — E785 Hyperlipidemia, unspecified: Secondary | ICD-10-CM | POA: Diagnosis not present

## 2020-07-22 NOTE — Assessment & Plan Note (Signed)
Pt use trazodone for this, and it is helping him to sleep. He doesn't drink or use any recreational drugs

## 2020-07-22 NOTE — Assessment & Plan Note (Signed)
-   I instructed the patient to stop smoking and provided them with smoking cessation materials.  - I informed the patient that smoking puts them at increased risk for cancer, COPD, hypertension, and more.  - Informed the patient to seek help if they begin to have trouble breathing, develop chest pain, start to cough up blood, feel faint, or pass out.  

## 2020-07-22 NOTE — Progress Notes (Signed)
Established Patient Office Visit  SUBJECTIVE:  Subjective  Patient ID: Trevor Harmon, male    DOB: [birth year]  Age: 54 y.o. MRN: [id number]  CC:  Chief Complaint  Patient presents with   Follow-up    Pt presents today for a 10 day follow up from ear and jaw pain. Patient reports he feels much better today and has no concerns.    HPI Trevor Harmon is a 54 y.o. male presenting today for a 10 day follow up of his ear and jaw pain.   He is feeling better overall. He completed his amoxicillin 500 mg. Since his jaw pain has subsided, he has been sleeping much better.   He wants to quit smoking.   Past Medical History:  Diagnosis Date   GERD (gastroesophageal reflux disease)    Hypertension     Past Surgical History:  Procedure Laterality Date   kidney stent     OTHER SURGICAL HISTORY     stent kidney    History reviewed. No pertinent family history.  Social History   Socioeconomic History   Marital status: Single    Spouse name: Not on file   Number of children: Not on file   Years of education: Not on file   Highest education level: Not on file  Occupational History   Not on file  Tobacco Use   Smoking status: Current Every Day Smoker    Packs/day: 1.00    Types: Cigarettes   Smokeless tobacco: Never Used  Substance and Sexual Activity   Alcohol use: No   Drug use: No   Sexual activity: Not on file  Other Topics Concern   Not on file  Social History Narrative   Not on file   Social Determinants of Health   Financial Resource Strain:    Difficulty of Paying Living Expenses: Not on file  Food Insecurity:    Worried About Running Out of Food in the Last Year: Not on file   Ran Out of Food in the Last Year: Not on file  Transportation Needs:    Lack of Transportation (Medical): Not on file   Lack of Transportation (Non-Medical): Not on file  Physical Activity:    Days of Exercise per Week: Not on file   Minutes of Exercise per  Session: Not on file  Stress:    Feeling of Stress : Not on file  Social Connections:    Frequency of Communication with Friends and Family: Not on file   Frequency of Social Gatherings with Friends and Family: Not on file   Attends Religious Services: Not on file   Active Member of Clubs or Organizations: Not on file   Attends Banker Meetings: Not on file   Marital Status: Not on file  Intimate Partner Violence:    Fear of Current or Ex-Partner: Not on file   Emotionally Abused: Not on file   Physically Abused: Not on file   Sexually Abused: Not on file     Current Outpatient Medications:    ARIPiprazole (ABILIFY) 10 MG tablet, Take 10 mg by mouth daily., Disp: , Rfl:    Fluticasone-Salmeterol (ADVAIR) 250-50 MCG/DOSE AEPB, Inhale 1 puff into the lungs 2 (two) times daily., Disp: , Rfl:    ibuprofen (ADVIL) 800 MG tablet, Take 1 tablet (800 mg total) by mouth every 8 (eight) hours as needed., Disp: 30 tablet, Rfl: 0   potassium chloride (K-DUR) 10 MEQ tablet, Take 10 mEq by mouth daily., Disp: ,  Rfl:    pravastatin (PRAVACHOL) 40 MG tablet, TAKE 1 TABLET BY MOUTH DAILY, Disp: 30 tablet, Rfl: 6   propranolol (INDERAL) 20 MG tablet, TAKE 1 TABLET BY MOUTH 2 TIMES DAILY, Disp: 60 tablet, Rfl: 3   ranitidine (ZANTAC) 150 MG tablet, Take 150 mg by mouth 2 (two) times daily., Disp: , Rfl:    traZODone (DESYREL) 100 MG tablet, TAKE 1 TABLET BY MOUTH AT BEDTIME, Disp: 30 tablet, Rfl: 3   triamterene-hydrochlorothiazide (DYAZIDE) 37.5-25 MG per capsule, Take 1 capsule by mouth daily., Disp: , Rfl:    Vitamin D, Ergocalciferol, (DRISDOL) 50000 UNITS CAPS capsule, Take 50,000 Units by mouth every 7 (seven) days., Disp: , Rfl:    amoxicillin (AMOXIL) 500 MG capsule, Take 1 capsule (500 mg total) by mouth 3 (three) times daily. (Patient not taking: Reported on 07/22/2020), Disp: 30 capsule, Rfl: 0   No Known Allergies  ROS Review of Systems  Constitutional:  Negative.   HENT: Negative.  Negative for dental problem and ear pain.   Eyes: Negative.   Respiratory: Negative.   Cardiovascular: Negative.   Gastrointestinal: Negative.   Endocrine: Negative.   Genitourinary: Negative.   Musculoskeletal: Negative.   Skin: Negative.   Allergic/Immunologic: Negative.   Neurological: Negative.   Hematological: Negative.   Psychiatric/Behavioral: Negative.   All other systems reviewed and are negative.    OBJECTIVE:    Physical Exam Vitals reviewed.  Constitutional:      Appearance: Normal appearance.  HENT:     Mouth/Throat:     Mouth: Mucous membranes are moist.  Eyes:     Pupils: Pupils are equal, round, and reactive to light.  Neck:     Vascular: No carotid bruit.  Cardiovascular:     Rate and Rhythm: Normal rate and regular rhythm.     Pulses: Normal pulses.     Heart sounds: Normal heart sounds.  Pulmonary:     Effort: Pulmonary effort is normal.     Breath sounds: Normal breath sounds.  Abdominal:     General: Bowel sounds are normal.     Palpations: Abdomen is soft. There is no hepatomegaly, splenomegaly or mass.     Tenderness: There is no abdominal tenderness.     Hernia: No hernia is present.  Musculoskeletal:     Cervical back: Neck supple.     Right lower leg: No edema.     Left lower leg: No edema.  Skin:    Findings: No rash.  Neurological:     Mental Status: He is alert and oriented to person, place, and time.     Motor: No weakness.  Psychiatric:        Mood and Affect: Mood normal.        Behavior: Behavior normal.     BP (!) 147/99    Pulse (!) 55    Ht 6' (1.829 m)    Wt 187 lb 1.6 oz (84.9 kg)    BMI 25.38 kg/m  Wt Readings from Last 3 Encounters:  07/22/20 187 lb 1.6 oz (84.9 kg)  07/08/20 179 lb (81.2 kg)  07/06/20 178 lb 14.4 oz (81.1 kg)    Health Maintenance Due  Topic Date Due   Hepatitis C Screening  Never done   COVID-19 Vaccine (1) Never done   HIV Screening  Never done    TETANUS/TDAP  Never done   COLONOSCOPY  Never done   INFLUENZA VACCINE  06/09/2020    There are no preventive care reminders  to display for this patient.  CBC Latest Ref Rng & Units 11/10/2014 03/07/2014 03/04/2014  WBC 3.8 - 10.6 x10 3/mm 3 6.2 9.9 9.7  Hemoglobin 13.0 - 18.0 g/dL 00.3 70.4 88.8  Hematocrit 40.0 - 52.0 % 41.2 44.3 44.5  Platelets 150 - 440 x10 3/mm 3 203 224 224   CMP Latest Ref Rng & Units 11/10/2014 03/07/2014 03/04/2014  Glucose 65 - 99 mg/dL 88 916(X) 450(T)  BUN 7 - 18 mg/dL 12 12 13   Creatinine 0.60 - 1.30 mg/dL ) 8.88(K) 8.00(L)  Sodium 136 - 145 mmol/L 139 136 138  Potassium 3.5 - 5.1 mmol/L 3.8 3.8 3.5  Chloride 98 - 107 mmol/L 106 102 103  CO2 21 - 32 mmol/L 28 29 30   Calcium 8.5 - 10.1 mg/dL 8.3(L) 8.9 9.3  Total Protein 6.4 - 8.2 g/dL - 7.7 7.9  Total Bilirubin 0.2 - 1.0 mg/dL - 0.3 0.4  Alkaline Phos Unit/L - 93 97  AST 15 - 37 Unit/L - 42(H) 38(H)  ALT 12 - 78 U/L - 29 29    No results found for: TSH Lab Results  Component Value Date   ALBUMIN 4.0 03/07/2014   ANIONGAP 5 (L) 11/10/2014   No results found for: CHOL, HDL, LDLCALC, CHOLHDL No results found for: TRIG No results found for: HGBA1C    ASSESSMENT & PLAN:   Problem List Items Addressed This Visit      Cardiovascular and Mediastinum   Essential hypertension    - Today, the patient's blood pressure is well managed on betablockers. - The patient will continue the current treatment regimen.  - I encouraged the patient to eat a low-sodium diet to help control blood pressure. - I encouraged the patient to live an active lifestyle and complete activities that increases heart rate to 85% target heart rate at least 5 times per week for one hour.            Other   Dyslipidemia (high LDL; low HDL)    - The patient's hyperlipidemia is stable on prevocar. - The patient will continue the current treatment regimen.  - I encouraged the patient to eat more vegetables and whole wheat,  and to avoid fatty foods like whole milk, hard cheese, egg yolks, margarine, baked sweets, and fried foods.  - I encouraged the patient to live an active lifestyle and complete activities for 40 minutes at least three times per week.  - I instructed the patient to go to the ER if they begin having chest pain.        Tobacco abuse counseling - Primary    - I instructed the patient to stop smoking and provided them with smoking cessation materials.  - I informed the patient that smoking puts them at increased risk for cancer, COPD, hypertension, and more.  - Informed the patient to seek help if they begin to have trouble breathing, develop chest pain, start to cough up blood, feel faint, or pass out.       Primary insomnia    Pt use trazodone for this, and it is helping him to sleep. He doesn't drink or use any recreational drugs         No orders of the defined types were placed in this encounter.   Follow-up: Return in about 3 months (around 10/21/2020).    01/09/2015, MD [facility] [address] [place], [address] [place]   By signing my name below, I, [place], attest  that this documentation has been prepared under the direction and in the presence of Dr. Corky DownsJaved Amando Chaput Electronically Signed: Corky DownsJaved Fredonia Casalino, MD 07/22/20, 12:21 PM  I personally performed the services described in this documentation, which was SCRIBED in my presence. The recorded information has been reviewed and considered accurate. It has been edited as necessary during review. Corky DownsJaved Santos Hardwick, MD

## 2020-07-22 NOTE — Assessment & Plan Note (Signed)
-   Today, the patient's blood pressure is well managed on betablockers. - The patient will continue the current treatment regimen.  - I encouraged the patient to eat a low-sodium diet to help control blood pressure. - I encouraged the patient to live an active lifestyle and complete activities that increases heart rate to 85% target heart rate at least 5 times per week for one hour.     

## 2020-07-22 NOTE — Assessment & Plan Note (Signed)
-   The patient's hyperlipidemia is stable on prevocar. - The patient will continue the current treatment regimen.  - I encouraged the patient to eat more vegetables and whole wheat, and to avoid fatty foods like whole milk, hard cheese, egg yolks, margarine, baked sweets, and fried foods.  - I encouraged the patient to live an active lifestyle and complete activities for 40 minutes at least three times per week.  - I instructed the patient to go to the ER if they begin having chest pain.

## 2020-07-29 ENCOUNTER — Other Ambulatory Visit: Payer: Self-pay | Admitting: Internal Medicine

## 2020-08-06 ENCOUNTER — Other Ambulatory Visit: Payer: Self-pay | Admitting: *Deleted

## 2020-08-06 MED ORDER — PRAVASTATIN SODIUM 40 MG PO TABS: 40.0000 mg | ORAL_TABLET | Freq: Every day | ORAL | 3 refills | Status: DC

## 2020-08-12 ENCOUNTER — Ambulatory Visit: Payer: Medicare Other | Admitting: Internal Medicine

## 2020-10-16 ENCOUNTER — Other Ambulatory Visit: Payer: Self-pay | Admitting: Internal Medicine

## 2020-10-25 ENCOUNTER — Other Ambulatory Visit: Payer: Self-pay

## 2020-10-25 ENCOUNTER — Emergency Department
Admission: EM | Admit: 2020-10-25 | Discharge: 2020-10-25 | Disposition: A | Discharge status: Home / Self Care | Payer: Medicare Other | Attending: Student in an Organized Health Care Education/Training Program | Admitting: Student in an Organized Health Care Education/Training Program

## 2020-10-25 DIAGNOSIS — F17210 Nicotine dependence, cigarettes, uncomplicated: Secondary | ICD-10-CM | POA: Insufficient documentation

## 2020-10-25 DIAGNOSIS — K0889 Other specified disorders of teeth and supporting structures: Secondary | ICD-10-CM | POA: Diagnosis not present

## 2020-10-25 DIAGNOSIS — I10 Essential (primary) hypertension: Secondary | ICD-10-CM | POA: Diagnosis not present

## 2020-10-25 DIAGNOSIS — Z79899 Other long term (current) drug therapy: Secondary | ICD-10-CM | POA: Insufficient documentation

## 2020-10-25 MED ORDER — AMOXICILLIN-POT CLAVULANATE 875-125 MG PO TABS: 1.0000 | ORAL_TABLET | Freq: Two times a day (BID) | ORAL | 0 refills | Status: AC

## 2020-10-25 MED ORDER — ACETAMINOPHEN 500 MG PO TABS
1000.0000 mg | ORAL_TABLET | Freq: Once | ORAL | Status: AC
  Administered 2020-10-25: 1000 mg via ORAL
  Filled 2020-10-25: qty 2

## 2020-10-25 MED ORDER — IBUPROFEN 600 MG PO TABS
600.0000 mg | ORAL_TABLET | Freq: Once | ORAL | Status: AC
  Administered 2020-10-25: 600 mg via ORAL
  Filled 2020-10-25: qty 1

## 2020-10-25 MED ORDER — IBUPROFEN 800 MG PO TABS: 800.0000 mg | ORAL_TABLET | Freq: Three times a day (TID) | ORAL | 0 refills | Status: DC | PRN

## 2020-10-25 MED ORDER — LIDOCAINE VISCOUS HCL 2 % MT SOLN
15.0000 mL | Freq: Once | OROMUCOSAL | Status: AC
  Administered 2020-10-25: 15 mL via OROMUCOSAL
  Filled 2020-10-25: qty 15

## 2020-10-25 MED ORDER — LIDOCAINE VISCOUS HCL 2 % MT SOLN: 15.0000 mL | OROMUCOSAL | 0 refills | Status: AC | PRN

## 2020-10-25 NOTE — ED Notes (Signed)
Reviewed prescriptions with pt.

## 2020-10-25 NOTE — Discharge Instructions (Addendum)
Use tylenol, 1000 mg every 6 hours as needed for pain.   OPTIONS FOR DENTAL FOLLOW UP CARE  [place] Department of Health and Human Services - Local Safety Net Dental Clinics [email or web address]   [facility] [phone])  [facility] [phone])  [place] [phone])  [facility] [phone])  [facility] [phone]) This clinic caters to the indigent population and is on a lottery system. Location: [employer] [facility], [employer], [address], [place] Clinic Hours: Wednesdays from 6pm - 9pm, patients seen by a lottery system. For dates, call or go to [email or web address] Services: Cleanings, fillings and simple extractions. Payment Options: DENTAL WORK IS FREE OF CHARGE. Bring proof of income or support. Best way to get seen: Arrive at 5:15 pm - this is a lottery, NOT first come/first serve, so arriving earlier will not increase your chances of being seen.     [facility] [phone] Select option 1 for emergencies   Location: [facility], [place], [address], [place] Clinic Hours: No walk-ins accepted - call the day before to schedule an appointment. Check in times are 9:30 am and 1:30 pm. Services: Simple extractions, temporary fillings, pulpectomy/pulp debridement, uncomplicated abscess drainage. Payment Options: PAYMENT IS DUE AT THE TIME OF SERVICE.  Fee is usually $100-200, additional surgical procedures (e.g. abscess drainage) may be extra. Cash, checks, Visa/MasterCard accepted.  Can file Medicaid if patient is covered for dental - patient should call case worker to check. No discount for [facility] patients. Best way to get seen: MUST call the day before and get onto the schedule. Can usually be seen the next 1-2 days. No walk-ins accepted.      [facility] [phone]   Location: [facility], [address], [place] Clinic Hours: M, W, Th, F 8am or 1:30pm, Tues 9a or 1:30 - first come/first served. Services: Simple extractions, temporary fillings, uncomplicated abscess drainage.  You do not need to be an [facility] resident. Payment Options: PAYMENT IS DUE AT THE TIME OF SERVICE. Dental insurance, otherwise sliding scale - bring proof of income or support. Depending on income and treatment needed, cost is usually $50-200. Best way to get seen: Arrive early as it is first come/first served.     [facility] [phone]   Location: [address] Clinic Hours: Mon-Thu 8a-5p Services: Most basic dental services including extractions and fillings. Payment Options: PAYMENT IS DUE AT THE TIME OF SERVICE. Sliding scale, up to 50% off - bring proof if income or support. Medicaid with dental option accepted. Best way to get seen: Call to schedule an appointment, can usually be seen within 2 weeks OR they will try to see walk-ins - show up at 8a or 2p (you may have to wait).     [facility] [phone] [place] RESIDENTS ONLY   Location: [facility], [address] [place], [place] [address] Clinic Hours: By appointment only. Monday - Thursday 8am-5pm, Friday 8am-12pm Services: Cleanings, fillings, extractions. Payment Options: PAYMENT IS DUE AT THE TIME OF SERVICE. Cash, Visa or MasterCard. Sliding scale - $30 minimum per service. Best way to get seen: Come in to office, complete packet and make an appointment - need proof of income or support monies for each household member and proof of [facility] residence. Usually takes about a month to get in.  [facility] [phone]   Location: [address] [facility] Clinic Hours: Walk-in Urgent Care  Dental Services are offered Monday-Friday mornings only. The numbers of emergencies accepted daily is limited to the number of providers available. Maximum 15 - Mondays, Wednesdays & Thursdays Maximum 10 - Tuesdays & Fridays Services: You do not need to be a [facility] resident to be seen for a dental emergency. Emergencies are defined as pain, swelling, abnormal bleeding, or dental trauma. Walkins will receive x-rays if needed. NOTE: Dental cleaning is not an emergency. Payment Options: PAYMENT IS DUE AT THE TIME OF SERVICE. Minimum co-pay is $40.00 for uninsured patients. Minimum co-pay is $3.00 for Medicaid with dental coverage. Dental Insurance is accepted and must be presented at time of visit. Medicare does not cover dental. Forms of payment: Cash, credit card, checks. Best way to get seen: If not previously registered with the clinic, walk-in dental registration begins at 7:15 am and is on a first come/first serve basis. If previously registered with the clinic, call to make an appointment.     [facility] [phone] [place] RESIDENTS ONLY   Location: [address] [place], [place] Clinic Hours: Mon-Thu 10a-2p Services: Extractions only! Payment Options: FREE (donations accepted) - bring proof of income or support Best way to get seen: Call and schedule an appointment OR come at 8am on the 1st Monday of every month (except for holidays) when it is first come/first served.     [facility] [phone]   Location: [address] [place], [place] Clinic Hours: Friday mornings Services, Payment Options, Best way to get seen: Call for info

## 2020-10-25 NOTE — ED Notes (Signed)
See triage note. Caries noted upon assessment of teeth. Pt points to one of his front teeth as the culprit of his pain.

## 2020-10-25 NOTE — ED Provider Notes (Signed)
[facility] Emergency Department Provider Note  ____________________________________________   Event Date/Time   First MD Initiated Contact with Patient 10/25/20 1840     (approximate)  I have reviewed the triage vital signs and the nursing notes.   HISTORY  Chief Complaint Dental Pain  HPI RUDDY SWIRE is a 54 y.o. male who presents today for evaluation of dental pain.  The patient reports pain located at tooth #10. He states it began suddenly today while at work. He does not remember biting anything abnormal. He does not know of any cavities.  He denies trauma.  He denies any fever, chills or swelling that he is aware of.  He states that he attempted He has not tried any alleviating factors to this point.  His pain is rated a 10/10.         Past Medical History:  Diagnosis Date  . GERD (gastroesophageal reflux disease)   . Hypertension     Patient Active Problem List   Diagnosis Date Noted  . Essential hypertension 07/08/2020  . Arthralgia of left temporomandibular joint 07/08/2020  . Dyslipidemia (high LDL; low HDL) 07/08/2020  . Tobacco abuse counseling 07/08/2020  . Primary insomnia 07/08/2020    Past Surgical History:  Procedure Laterality Date  . kidney stent    . OTHER SURGICAL HISTORY     stent kidney    Prior to Admission medications   Medication Sig Start Date End Date Taking? Authorizing Provider  amoxicillin-clavulanate (AUGMENTIN) 875-125 MG tablet Take 1 tablet by mouth 2 (two) times daily for 7 days. 10/25/20 11/01/20  Lucy Chris, PA  ARIPiprazole (ABILIFY) 10 MG tablet Take 10 mg by mouth daily.    [provider]  Fluticasone-Salmeterol (ADVAIR) 250-50 MCG/DOSE AEPB Inhale 1 puff into the lungs 2 (two) times daily.    [provider]  ibuprofen (ADVIL) 800 MG tablet Take 1 tablet (800 mg total) by mouth every 8 (eight) hours as needed. 10/25/20   Lucy Chris, PA  lidocaine (XYLOCAINE) 2 %  solution Use as directed 15 mLs in the mouth or throat as needed for up to 5 days for mouth pain. 10/25/20 10/30/20  Lucy Chris, PA  potassium chloride (K-DUR) 10 MEQ tablet Take 10 mEq by mouth daily.    [provider]  pravastatin (PRAVACHOL) 40 MG tablet Take 1 tablet (40 mg total) by mouth daily. 08/06/20   Corky Downs, MD  propranolol (INDERAL) 20 MG tablet TAKE 1 TABLET BY MOUTH 2 TIMES DAILY 10/16/20   Corky Downs, MD  ranitidine (ZANTAC) 150 MG tablet Take 150 mg by mouth 2 (two) times daily.    [provider]  traZODone (DESYREL) 100 MG tablet TAKE 1 TABLET BY MOUTH AT BEDTIME 10/16/20   Corky Downs, MD  triamterene-hydrochlorothiazide (DYAZIDE) 37.5-25 MG per capsule Take 1 capsule by mouth daily.    [provider]  triamterene-hydrochlorothiazide (MAXZIDE-25) 37.5-25 MG tablet TAKE 1 TABLET BY MOUTH DAILY 07/29/20   Corky Downs, MD  Vitamin D, Ergocalciferol, (DRISDOL) 50000 UNITS CAPS capsule Take 50,000 Units by mouth every 7 (seven) days.    [provider]    Allergies Patient has no known allergies.  No family history on file.  Social History Social History   Tobacco Use  . Smoking status: Current Every Day Smoker    Packs/day: 1.00    Types: Cigarettes  . Smokeless tobacco: Never Used  Substance Use Topics  . Alcohol use: No  .  Drug use: No    Review of Systems Constitutional: No fever/chills Eyes: No visual changes. ENT: + Dental pain, no sore throat. Cardiovascular: Denies chest pain. Respiratory: Denies shortness of breath. Gastrointestinal: No abdominal pain.  No nausea, no vomiting.  No diarrhea.  No constipation. Genitourinary: Negative for dysuria. Musculoskeletal: Negative for back pain. Skin: Negative for rash. Neurological: Negative for headaches, focal weakness or numbness.  ____________________________________________   PHYSICAL EXAM:  VITAL SIGNS: ED Triage Vitals  Enc Vitals Group     BP  10/25/20 1724 (!) 144/65     Pulse Rate 10/25/20 1724 61     Resp 10/25/20 1724 18     Temp 10/25/20 1724 98.6 F (37 C)     Temp Source 10/25/20 1724 Oral     SpO2 10/25/20 1724 98 %     Weight 10/25/20 1724 187 lb (84.8 kg)     Height 10/25/20 1724 6' (1.829 m)     Head Circumference --      Peak Flow --      Pain Score 10/25/20 1730 10     Pain Loc --      Pain Edu? --      Excl. in GC? --    Constitutional: Alert and oriented. Well appearing and in no acute distress. Eyes: Conjunctivae are normal. PERRL. EOMI. Head: Atraumatic. Nose: No congestion/rhinnorhea. Mouth/Throat: Mucous membranes are moist.  Oropharynx non-erythematous.  Overall poor dentition with multiple dental caries noted throughout the mouth.  Exam around tooth #10 at site of pain does not reveal any obvious crack, no nearby swelling or abscess.  Tooth is not loose to palpation. Neck: No stridor.  Lymphatic: No cervical lymphadenopathy. Cardiovascular: Normal rate, regular rhythm.   Good peripheral circulation. Respiratory: Normal respiratory effort.  No retractions.  Skin:  Skin is warm, dry and intact. No rash noted. Psychiatric: Mood and affect are normal. Speech and behavior are normal.   ____________________________________________   INITIAL IMPRESSION / ASSESSMENT AND PLAN / ED COURSE  As part of my medical decision making, I reviewed the following data within the electronic MEDICAL RECORD NUMBER Nursing notes reviewed and incorporated        Patient is a 54 year old male who presents to the emergency department for evaluation of dental pain present for today.  See HPI for further details.  Physical exam does not reveal any obvious facial swelling, abscess or other signs of more significant infection.  Patient was offered multimodal approach to his dental pain.  He declined dental block at this time.  He will proceed with viscous lidocaine solution, Tylenol and anti-inflammatory combination.  He is amenable  with this plan and was given information for dental clinic follow-up.  He is stable at this time for outpatient therapy.      ____________________________________________   FINAL CLINICAL IMPRESSION(S) / ED DIAGNOSES  Final diagnoses:  Pain, dental     ED Discharge Orders         Ordered    lidocaine (XYLOCAINE) 2 % solution  As needed        10/25/20 1845    ibuprofen (ADVIL) 800 MG tablet  Every 8 hours PRN        10/25/20 1845    amoxicillin-clavulanate (AUGMENTIN) 875-125 MG tablet  2 times daily        10/25/20 1845          *Please note:  [name] was evaluated in Emergency Department on 10/25/2020 for the symptoms described in  the history of present illness. He was evaluated in the context of the global COVID-19 pandemic, which necessitated consideration that the patient might be at risk for infection with the SARS-CoV-2 virus that causes COVID-19. Institutional protocols and algorithms that pertain to the evaluation of patients at risk for COVID-19 are in a state of rapid change based on information released by regulatory bodies including the CDC and federal and state organizations. These policies and algorithms were followed during the patient's care in the ED.  Some ED evaluations and interventions may be delayed as a result of limited staffing during and the pandemic.*   Note:  This document was prepared using Dragon voice recognition software and may include unintentional dictation errors.    Lucy Chris, PA 10/25/20 2209    Willy Eddy, MD 10/25/20 [phone]

## 2020-10-25 NOTE — ED Triage Notes (Signed)
PT to ED for tootheache starting this afternoon. No hx of same, no cavities that pt knows of

## 2021-04-15 ENCOUNTER — Other Ambulatory Visit: Payer: Self-pay | Admitting: Internal Medicine

## 2021-04-29 ENCOUNTER — Other Ambulatory Visit: Payer: Self-pay

## 2021-04-29 ENCOUNTER — Ambulatory Visit (INDEPENDENT_AMBULATORY_CARE_PROVIDER_SITE_OTHER): Payer: Medicare Other | Admitting: Internal Medicine

## 2021-04-29 ENCOUNTER — Encounter: Payer: Self-pay | Admitting: Internal Medicine

## 2021-04-29 VITALS — BP 121/86 | HR 87 | Ht 72.0 in | Wt 186.1 lb

## 2021-04-29 DIAGNOSIS — Z716 Tobacco abuse counseling: Secondary | ICD-10-CM

## 2021-04-29 DIAGNOSIS — I10 Essential (primary) hypertension: Secondary | ICD-10-CM

## 2021-04-29 DIAGNOSIS — E785 Hyperlipidemia, unspecified: Secondary | ICD-10-CM

## 2021-04-29 DIAGNOSIS — T63441A Toxic effect of venom of bees, accidental (unintentional), initial encounter: Secondary | ICD-10-CM | POA: Diagnosis not present

## 2021-04-29 MED ORDER — METHYLPREDNISOLONE 4 MG PO TBPK: ORAL_TABLET | ORAL | 1 refills | Status: DC

## 2021-04-29 MED ORDER — LORATADINE 10 MG PO TABS: 10.0000 mg | ORAL_TABLET | Freq: Every day | ORAL | 11 refills | Status: AC

## 2021-04-29 NOTE — Progress Notes (Signed)
Established Patient Office Visit  Subjective:  Patient ID: Trevor Harmon, male    DOB: February 14, 1966  Age: 55 y.o. MRN: [id number]  CC:  Chief Complaint  Patient presents with   Insect Bite    Patient was stung by yellow jacket and face is swollen at site. Patient not having any sob or trouble swallowing.     HPI  Trevor Harmon presents for patient has a yellowjacket bite on the right eye right eyelid is swollen. He does not have any wheezing there is no swelling of the tongue or mouth there is no swelling of the eye inside.   Past Medical History:  Diagnosis Date   GERD (gastroesophageal reflux disease)    Hypertension     Past Surgical History:  Procedure Laterality Date   kidney stent     OTHER SURGICAL HISTORY     stent kidney    No family history on file.  Social History   Socioeconomic History   Marital status: Single    Spouse name: Not on file   Number of children: Not on file   Years of education: Not on file   Highest education level: Not on file  Occupational History   Not on file  Tobacco Use   Smoking status: Every Day    Packs/day: 1.00    Pack years: 0.00    Types: Cigarettes   Smokeless tobacco: Never  Substance and Sexual Activity   Alcohol use: No   Drug use: No   Sexual activity: Not on file  Other Topics Concern   Not on file  Social History Narrative   Not on file   Social Determinants of Health   Financial Resource Strain: Not on file  Food Insecurity: Not on file  Transportation Needs: Not on file  Physical Activity: Not on file  Stress: Not on file  Social Connections: Not on file  Intimate Partner Violence: Not on file     Current Outpatient Medications:    ARIPiprazole (ABILIFY) 10 MG tablet, Take 10 mg by mouth daily., Disp: , Rfl:    Fluticasone-Salmeterol (ADVAIR) 250-50 MCG/DOSE AEPB, Inhale 1 puff into the lungs 2 (two) times daily., Disp: , Rfl:    ibuprofen (ADVIL) 800 MG tablet, Take 1 tablet (800 mg total) by mouth  every 8 (eight) hours as needed., Disp: 30 tablet, Rfl: 0   loratadine (CLARITIN) 10 MG tablet, Take 1 tablet (10 mg total) by mouth daily., Disp: 30 tablet, Rfl: 11   methylPREDNISolone (MEDROL DOSEPAK) 4 MG TBPK tablet, Medrol Dosepak 4 mg as directed dispense 21 tablets, Disp: 21 tablet, Rfl: 1   potassium chloride (K-DUR) 10 MEQ tablet, Take 10 mEq by mouth daily., Disp: , Rfl:    pravastatin (PRAVACHOL) 40 MG tablet, Take 1 tablet (40 mg total) by mouth daily., Disp: 90 tablet, Rfl: 3   propranolol (INDERAL) 20 MG tablet, TAKE 1 TABLET BY MOUTH TWICE A DAY, Disp: 60 tablet, Rfl: 3   ranitidine (ZANTAC) 150 MG tablet, Take 150 mg by mouth 2 (two) times daily., Disp: , Rfl:    traZODone (DESYREL) 100 MG tablet, TAKE 1 TABLET BY MOUTH AT BEDTIME, Disp: 30 tablet, Rfl: 3   triamterene-hydrochlorothiazide (DYAZIDE) 37.5-25 MG per capsule, Take 1 capsule by mouth daily., Disp: , Rfl:    triamterene-hydrochlorothiazide (MAXZIDE-25) 37.5-25 MG tablet, TAKE 1 TABLET BY MOUTH DAILY, Disp: 30 tablet, Rfl: 6   Vitamin D, Ergocalciferol, (DRISDOL) 50000 UNITS CAPS capsule, Take 50,000 Units by mouth  every 7 (seven) days., Disp: , Rfl:    No Known Allergies  ROS Review of Systems  Constitutional: Negative.   HENT: Negative.         Right periorbital region is swollen.  Eyes: Negative.   Respiratory: Negative.    Cardiovascular: Negative.   Gastrointestinal: Negative.   Endocrine: Negative.   Genitourinary: Negative.   Musculoskeletal: Negative.   Skin: Negative.   Allergic/Immunologic: Negative.   Neurological: Negative.   Hematological: Negative.   Psychiatric/Behavioral: Negative.    All other systems reviewed and are negative.    Objective:    Physical Exam Vitals reviewed.  Constitutional:      Appearance: Normal appearance.  HENT:     Mouth/Throat:     Mouth: Mucous membranes are moist.  Eyes:     Pupils: Pupils are equal, round, and reactive to light.      Comments:  Yellowjacket bite.  Neck:     Vascular: No carotid bruit.  Cardiovascular:     Rate and Rhythm: Normal rate and regular rhythm.     Pulses: Normal pulses.     Heart sounds: Normal heart sounds.  Pulmonary:     Effort: Pulmonary effort is normal.     Breath sounds: Normal breath sounds.  Abdominal:     General: Bowel sounds are normal.     Palpations: Abdomen is soft. There is no hepatomegaly, splenomegaly or mass.     Tenderness: There is no abdominal tenderness.     Hernia: No hernia is present.  Musculoskeletal:     Cervical back: Neck supple.     Right lower leg: No edema.     Left lower leg: No edema.  Skin:    Findings: No rash.  Neurological:     Mental Status: He is alert and oriented to person, place, and time.     Motor: No weakness.  Psychiatric:        Mood and Affect: Mood normal.        Behavior: Behavior normal.    BP 121/86   Pulse 87   Ht 6' (1.829 m)   Wt 186 lb 1.6 oz (84.4 kg)   BMI 25.24 kg/m  Wt Readings from Last 3 Encounters:  04/29/21 186 lb 1.6 oz (84.4 kg)  10/25/20 187 lb (84.8 kg)  07/22/20 187 lb 1.6 oz (84.9 kg)     Health Maintenance Due  Topic Date Due   COVID-19 Vaccine (1) Never done   Pneumococcal Vaccine 35-51 Years old (1 - PCV) Never done   HIV Screening  Never done   Hepatitis C Screening  Never done   TETANUS/TDAP  Never done   COLONOSCOPY (Pts 45-94yrs Insurance coverage will need to be confirmed)  Never done   Zoster Vaccines- Shingrix (1 of 2) Never done    There are no preventive care reminders to display for this patient.  No results found for: TSH Lab Results  Component Value Date   WBC 6.2 11/10/2014   HGB 13.3 11/10/2014   HCT 41.2 11/10/2014   MCV 92 11/10/2014   PLT 203 11/10/2014   Lab Results  Component Value Date   NA 139 11/10/2014   K 3.8 11/10/2014   CO2 28 11/10/2014   GLUCOSE 88 11/10/2014   BUN 12 11/10/2014   CREATININE 1.46 (H) 11/10/2014   BILITOT 0.3 03/07/2014   ALKPHOS 93  03/07/2014   AST 42 (H) 03/07/2014   ALT 29 03/07/2014   PROT 7.7 03/07/2014  ALBUMIN 4.0 03/07/2014   CALCIUM 8.3 (L) 11/10/2014   ANIONGAP 5 (L) 11/10/2014   No results found for: CHOL No results found for: HDL No results found for: LDLCALC No results found for: TRIG No results found for: CHOLHDL No results found for: ZOXW9U    Assessment & Plan:   Problem List Items Addressed This Visit       Cardiovascular and Mediastinum   Essential hypertension - Primary    Patient blood pressure is normal patient denies any chest pain or shortness of breath there is no history of palpitation or paroxysmal nocturnal dyspnea   patient was advised to follow low-salt low-cholesterol diet    ideally I want to keep systolic blood pressure below 045 mmHg, patient was asked to check blood pressure one times a week and give me a report on that.  Patient will be follow-up in 3 months  or earlier as needed, patient will call me back for any change in the cardiovascular symptoms Patient was advised to buy a book from local bookstore concerning blood pressure and read several chapters  every day.  This will be supplemented by some of the material we will give him from the office.  Patient should also utilize other resources like YouTube and Internet to learn more about the blood pressure and the diet.         Other   Dyslipidemia (high LDL; low HDL)    Hypercholesterolemia  I advised the patient to follow Mediterranean diet This diet is rich in fruits vegetables and whole grain, and This diet is also rich in fish and lean meat Patient should also eat a handful of almonds or walnuts daily Recent heart study indicated that average follow-up on this kind of diet reduces the cardiovascular mortality by 50 to 70%==       Tobacco abuse counseling    - I instructed the patient to stop smoking and provided them with smoking cessation materials.  - I informed the patient that smoking puts them at  increased risk for cancer, COPD, hypertension, and more.  - Informed the patient to seek help if they begin to have trouble breathing, develop chest pain, start to cough up blood, feel faint, or pass out.       Bee sting reaction    Right periorbital area is swollen.    normal examination of the cornea, sclera is nonicteric, We will give her a steroid injection Kenalog 40 mg intramuscular       Relevant Medications   loratadine (CLARITIN) 10 MG tablet   methylPREDNISolone (MEDROL DOSEPAK) 4 MG TBPK tablet    Meds ordered this encounter  Medications   loratadine (CLARITIN) 10 MG tablet    Sig: Take 1 tablet (10 mg total) by mouth daily.    Dispense:  30 tablet    Refill:  11   methylPREDNISolone (MEDROL DOSEPAK) 4 MG TBPK tablet    Sig: Medrol Dosepak 4 mg as directed dispense 21 tablets    Dispense:  21 tablet    Refill:  1    Follow-up: No follow-ups on file.    Corky Downs, MD

## 2021-04-29 NOTE — Assessment & Plan Note (Signed)

## 2021-04-29 NOTE — Assessment & Plan Note (Signed)
Hypercholesterolemia  I advised the patient to follow Mediterranean diet This diet is rich in fruits vegetables and whole grain, and This diet is also rich in fish and lean meat Patient should also eat a handful of almonds or walnuts daily Recent heart study indicated that average follow-up on this kind of diet reduces the cardiovascular mortality by 50 to 70%== 

## 2021-04-29 NOTE — Assessment & Plan Note (Signed)
-   I instructed the patient to stop smoking and provided them with smoking cessation materials.  - I informed the patient that smoking puts them at increased risk for cancer, COPD, hypertension, and more.  - Informed the patient to seek help if they begin to have trouble breathing, develop chest pain, start to cough up blood, feel faint, or pass out.  

## 2021-04-29 NOTE — Assessment & Plan Note (Addendum)
Right periorbital area is swollen.    normal examination of the cornea, sclera is nonicteric, We will give her a steroid injection Kenalog 40 mg intramuscular

## 2021-06-06 ENCOUNTER — Other Ambulatory Visit: Payer: Self-pay

## 2021-06-06 ENCOUNTER — Ambulatory Visit (INDEPENDENT_AMBULATORY_CARE_PROVIDER_SITE_OTHER): Payer: Medicare Other | Admitting: Internal Medicine

## 2021-06-06 DIAGNOSIS — Z0000 Encounter for general adult medical examination without abnormal findings: Secondary | ICD-10-CM | POA: Diagnosis not present

## 2021-06-06 DIAGNOSIS — Z1211 Encounter for screening for malignant neoplasm of colon: Secondary | ICD-10-CM

## 2021-06-06 NOTE — Patient Instructions (Signed)
  Mr. Trevor Harmon , Thank you for taking time to come for your Medicare Wellness Visit. I appreciate your ongoing commitment to your health goals. Please review the following plan we discussed and let me know if I can assist you in the future.   These are the goals we discussed:  Goals   None     This is a list of the screening recommended for you and due dates:  Health Maintenance  Topic Date Due   HIV Screening  Never done   Hepatitis C Screening: USPSTF Recommendation to screen - Ages 47-79 yo.  Never done   Tetanus Vaccine  Never done   Colon Cancer Screening  Never done   Zoster (Shingles) Vaccine (1 of 2) Never done   COVID-19 Vaccine (2 - Pfizer series) 08/21/2020   Flu Shot  06/09/2021   Pneumococcal Vaccination  Aged Out   HPV Vaccine  Aged Out

## 2021-06-06 NOTE — Progress Notes (Signed)
Subjective:   Trevor Harmon is a 55 y.o. male who presents for an Initial Medicare Annual Wellness Visit.  Review of Systems     Cardiac Risk Factors include: hypertension     Objective:    There were no vitals filed for this visit. There is no height or weight on file to calculate BMI.  Advanced Directives 06/06/2021 10/25/2020 08/17/2019 04/04/2016 11/29/2015 03/11/2015  Does Patient Have a Medical Advance Directive? No No No No No No  Would patient like information on creating a medical advance directive? No - Patient declined No - Patient declined No - Patient declined Yes - Educational materials given No - patient declined information No - patient declined information    Current Medications (verified) Outpatient Encounter Medications as of 06/06/2021  Medication Sig   ARIPiprazole (ABILIFY) 10 MG tablet Take 10 mg by mouth daily.   Fluticasone-Salmeterol (ADVAIR) 250-50 MCG/DOSE AEPB Inhale 1 puff into the lungs 2 (two) times daily.   ibuprofen (ADVIL) 800 MG tablet Take 1 tablet (800 mg total) by mouth every 8 (eight) hours as needed.   loratadine (CLARITIN) 10 MG tablet Take 1 tablet (10 mg total) by mouth daily.   potassium chloride (K-DUR) 10 MEQ tablet Take 10 mEq by mouth daily.   pravastatin (PRAVACHOL) 40 MG tablet Take 1 tablet (40 mg total) by mouth daily.   propranolol (INDERAL) 20 MG tablet TAKE 1 TABLET BY MOUTH TWICE A DAY   ranitidine (ZANTAC) 150 MG tablet Take 150 mg by mouth 2 (two) times daily.   traZODone (DESYREL) 100 MG tablet TAKE 1 TABLET BY MOUTH AT BEDTIME   triamterene-hydrochlorothiazide (MAXZIDE-25) 37.5-25 MG tablet TAKE 1 TABLET BY MOUTH DAILY   Vitamin D, Ergocalciferol, (DRISDOL) 50000 UNITS CAPS capsule Take 50,000 Units by mouth every 7 (seven) days. (Patient not taking: Reported on 06/06/2021)   [DISCONTINUED] methylPREDNISolone (MEDROL DOSEPAK) 4 MG TBPK tablet Medrol Dosepak 4 mg as directed dispense 21 tablets (Patient not taking: Reported on  06/06/2021)   [DISCONTINUED] triamterene-hydrochlorothiazide (DYAZIDE) 37.5-25 MG per capsule Take 1 capsule by mouth daily.   No facility-administered encounter medications on file as of 06/06/2021.    Allergies (verified) Patient has no known allergies.   History: Past Medical History:  Diagnosis Date   GERD (gastroesophageal reflux disease)    Hypertension    Past Surgical History:  Procedure Laterality Date   kidney stent     OTHER SURGICAL HISTORY     stent kidney   History reviewed. No pertinent family history. Social History   Socioeconomic History   Marital status: Single    Spouse name: Not on file   Number of children: Not on file   Years of education: Not on file   Highest education level: Not on file  Occupational History   Not on file  Tobacco Use   Smoking status: Former    Packs/day: 1.00    Types: Cigarettes   Smokeless tobacco: Never  Vaping Use   Vaping Use: Never used  Substance and Sexual Activity   Alcohol use: No   Drug use: No   Sexual activity: Not Currently  Other Topics Concern   Not on file  Social History Narrative   Not on file   Social Determinants of Health   Financial Resource Strain: Low Risk    Difficulty of Paying Living Expenses: Not very hard  Food Insecurity: No Food Insecurity   Worried About Running Out of Food in the Last Year: Never true  Ran Out of Food in the Last Year: Never true  Transportation Needs: No Transportation Needs   Lack of Transportation (Medical): No   Lack of Transportation (Non-Medical): No  Physical Activity: Sufficiently Active   Days of Exercise per Week: 7 days   Minutes of Exercise per Session: 40 min  Stress: No Stress Concern Present   Feeling of Stress : Not at all  Social Connections: Moderately Isolated   Frequency of Communication with Friends and Family: Once a week   Frequency of Social Gatherings with Friends and Family: Once a week   Attends Religious Services: More than 4  times per year   Active Member of [employer] or Organizations: Yes   Attends Banker Meetings: Never   Marital Status: Never married    Tobacco Counseling Counseling given: Not Answered   Clinical Intake:  Pre-visit preparation completed: Yes  Pain : No/denies pain     Nutritional Status: BMI 25 -29 Overweight Nutritional Risks: None Diabetes: No  How often do you need to have someone help you when you read instructions, pamphlets, or other written materials from your doctor or pharmacy?: 3 - Sometimes  Diabetic?No  Interpreter Needed?: No  Information entered by :: Carlyon Prows, CMA   Activities of Daily Living In your present state of health, do you have any difficulty performing the following activities: 06/06/2021  Hearing? N  Vision? N  Difficulty concentrating or making decisions? N  Walking or climbing stairs? N  Dressing or bathing? N  Doing errands, shopping? N  Preparing Food and eating ? N  Using the Toilet? N  In the past six months, have you accidently leaked urine? N  Do you have problems with loss of bowel control? N  Managing your Medications? N  Managing your Finances? N  Housekeeping or managing your Housekeeping? N  Some recent data might be hidden    Patient Care Team: Corky Downs, MD as PCP - General (Internal Medicine)  Indicate any recent Medical Services you may have received from other than [facility] providers in the past year (date may be approximate).     Assessment:   This is a routine wellness examination for [name].  Hearing/Vision screen No results found.  Dietary issues and exercise activities discussed: Current Exercise Habits: Home exercise routine, Type of exercise: walking, Time (Minutes): 40, Frequency (Times/Week): 7, Weekly Exercise (Minutes/Week): 280, Intensity: Mild, Exercise limited by: None identified   Goals Addressed   None    Depression Screen PHQ 2/9 Scores 06/06/2021  PHQ - 2 Score 0    Fall  Risk Fall Risk  06/06/2021  Falls in the past year? 0  Number falls in past yr: 0  Injury with Fall? 0  Risk for fall due to : No Fall Risks  Follow up Falls evaluation completed    FALL RISK PREVENTION PERTAINING TO THE HOME:  Any stairs in or around the home? No  If so, are there any without handrails? No  Home free of loose throw rugs in walkways, pet beds, electrical cords, etc? No  Adequate lighting in your home to reduce risk of falls? Yes   ASSISTIVE DEVICES UTILIZED TO PREVENT FALLS:  Life alert? No  Use of a cane, walker or w/c? No  Grab bars in the bathroom? No  Shower chair or bench in shower? No  Elevated toilet seat or a handicapped toilet? No   TIMED UP AND GO:  Was the test performed? No .  Length of time  to ambulate 10 feet: 0 sec.     Cognitive Function:     6CIT Screen 06/06/2021  What Year? 4 points  What month? 0 points  What time? 0 points  Count back from 20 0 points  Months in reverse 4 points  Repeat phrase 10 points  Total Score 18    Immunizations Immunization History  Administered Date(s) Administered   Influenza,inj,Quad PF,6+ Mos 08/28/2012   PFIZER(Purple Top)SARS-COV-2 Vaccination 07/31/2020   Pneumococcal Polysaccharide-23 08/28/2012    TDAP status: Due, Education has been provided regarding the importance of this vaccine. Advised may receive this vaccine at local pharmacy or Health Dept. Aware to provide a copy of the vaccination record if obtained from local pharmacy or Health Dept. Verbalized acceptance and understanding.  Flu Vaccine status: Up to date  Pneumococcal vaccine status: Up to date  Covid-19 vaccine status: Information provided on how to obtain vaccines.   Qualifies for Shingles Vaccine? Yes   Zostavax completed  N/A   Shingrix Completed?: No.    Education has been provided regarding the importance of this vaccine. Patient has been advised to call insurance company to determine out of pocket expense if they  have not yet received this vaccine. Advised may also receive vaccine at local pharmacy or Health Dept. Verbalized acceptance and understanding.  Screening Tests Health Maintenance  Topic Date Due   HIV Screening  Never done   Hepatitis C Screening  Never done   TETANUS/TDAP  Never done   COLONOSCOPY (Pts 45-37yrs Insurance coverage will need to be confirmed)  Never done   Zoster Vaccines- Shingrix (1 of 2) Never done   COVID-19 Vaccine (2 - Pfizer series) 08/21/2020   INFLUENZA VACCINE  06/09/2021   Pneumococcal Vaccine 63-9 Years old  Aged Out   HPV VACCINES  Aged Out    Health Maintenance  Health Maintenance Due  Topic Date Due   HIV Screening  Never done   Hepatitis C Screening  Never done   TETANUS/TDAP  Never done   COLONOSCOPY (Pts 45-90yrs Insurance coverage will need to be confirmed)  Never done   Zoster Vaccines- Shingrix (1 of 2) Never done   COVID-19 Vaccine (2 - Pfizer series) 08/21/2020    Colorectal cancer screening: Referral to GI placed 06/06/2021 to [facility] in [place]. Pt aware the office will call re: appt.  Lung Cancer Screening: (Low Dose CT Chest recommended if Age 68-80 years, 30 pack-year currently smoking OR have quit w/in 15years.) does qualify.   Lung Cancer Screening Referral: Patient has been advised to discuss CT Chest screening referral with PCP.  Additional Screening:  Hepatitis C Screening: does qualify; Completed No  Vision Screening: Recommended annual ophthalmology exams for early detection of glaucoma and other disorders of the eye. Is the patient up to date with their annual eye exam?  No  Who is the provider or what is the name of the office in which the patient attends annual eye exams? N/A If pt is not established with a provider, would they like to be referred to a provider to establish care?  N/A .   Dental Screening: Recommended annual dental exams for proper oral hygiene  Community Resource Referral / Chronic  Care Management: CRR required this visit?  No   CCM required this visit?  No      Plan:     I have personally reviewed and noted the following in the patient's chart:   Medical and social history Use of alcohol, tobacco  or illicit drugs  Current medications and supplements including opioid prescriptions. Patient is not currently taking opioid prescriptions. Functional ability and status Nutritional status Physical activity Advanced directives List of other physicians Hospitalizations, surgeries, and ER visits in previous 12 months Vitals Screenings to include cognitive, depression, and falls Referrals and appointments  In addition, I have reviewed and discussed with patient certain preventive protocols, quality metrics, and best practice recommendations. A written personalized care plan for preventive services as well as general preventive health recommendations were provided to patient.     Victorio Palm, CMA   06/06/2021   Nurse Notes:

## 2021-06-17 ENCOUNTER — Encounter: Payer: Self-pay | Admitting: Internal Medicine

## 2021-06-17 ENCOUNTER — Other Ambulatory Visit: Payer: Self-pay

## 2021-06-17 ENCOUNTER — Other Ambulatory Visit: Payer: Self-pay | Admitting: Internal Medicine

## 2021-06-17 ENCOUNTER — Ambulatory Visit (INDEPENDENT_AMBULATORY_CARE_PROVIDER_SITE_OTHER): Payer: Medicare Other | Admitting: Internal Medicine

## 2021-06-17 VITALS — BP 143/98 | HR 66 | Ht 72.0 in | Wt 187.6 lb

## 2021-06-17 DIAGNOSIS — E785 Hyperlipidemia, unspecified: Secondary | ICD-10-CM | POA: Diagnosis not present

## 2021-06-17 DIAGNOSIS — I10 Essential (primary) hypertension: Secondary | ICD-10-CM | POA: Diagnosis not present

## 2021-06-17 DIAGNOSIS — Z716 Tobacco abuse counseling: Secondary | ICD-10-CM | POA: Diagnosis not present

## 2021-06-17 DIAGNOSIS — F5101 Primary insomnia: Secondary | ICD-10-CM

## 2021-06-17 DIAGNOSIS — M7711 Lateral epicondylitis, right elbow: Secondary | ICD-10-CM

## 2021-06-17 MED ORDER — MELOXICAM 7.5 MG PO TABS: 7.5000 mg | ORAL_TABLET | Freq: Every day | ORAL | 0 refills | Status: DC

## 2021-06-17 NOTE — Progress Notes (Signed)
Established Patient Office Visit  Subjective:  Patient ID: Trevor Harmon, male    DOB: 1966-10-31  Age: 55 y.o. MRN: [id number]  CC:  Chief Complaint  Patient presents with   Arm Pain    Patient complains of right arm pain for the past week. Patient states he picked up a case of water at work and has had pain since.    Arm Pain    [name] presents for pain rt wrist  , pt works  in food [employer] Past Medical History:  Diagnosis Date   GERD (gastroesophageal reflux disease)    Hypertension     Past Surgical History:  Procedure Laterality Date   kidney stent     OTHER SURGICAL HISTORY     stent kidney    History reviewed. No pertinent family history.  Social History   Socioeconomic History   Marital status: Single    Spouse name: Not on file   Number of children: Not on file   Years of education: Not on file   Highest education level: Not on file  Occupational History   Not on file  Tobacco Use   Smoking status: Former    Packs/day: 1.00    Types: Cigarettes   Smokeless tobacco: Never  Vaping Use   Vaping Use: Never used  Substance and Sexual Activity   Alcohol use: No   Drug use: No   Sexual activity: Not Currently  Other Topics Concern   Not on file  Social History Narrative   Not on file   Social Determinants of Health   Financial Resource Strain: Low Risk    Difficulty of Paying Living Expenses: Not very hard  Food Insecurity: No Food Insecurity   Worried About Running Out of Food in the Last Year: Never true   Ran Out of Food in the Last Year: Never true  Transportation Needs: No Transportation Needs   Lack of Transportation (Medical): No   Lack of Transportation (Non-Medical): No  Physical Activity: Sufficiently Active   Days of Exercise per Week: 7 days   Minutes of Exercise per Session: 40 min  Stress: No Stress Concern Present   Feeling of Stress : Not at all  Social Connections: Moderately Isolated   Frequency of Communication with  Friends and Family: Once a week   Frequency of Social Gatherings with Friends and Family: Once a week   Attends Religious Services: More than 4 times per year   Active Member of [employer] or Organizations: Yes   Attends Banker Meetings: Never   Marital Status: Never married  Catering manager Violence: Not At Risk   Fear of Current or Ex-Partner: No   Emotionally Abused: No   Physically Abused: No   Sexually Abused: No     Current Outpatient Medications:    meloxicam (MOBIC) 7.5 MG tablet, Take 1 tablet (7.5 mg total) by mouth daily., Disp: 30 tablet, Rfl: 0   ARIPiprazole (ABILIFY) 10 MG tablet, Take 10 mg by mouth daily., Disp: , Rfl:    Fluticasone-Salmeterol (ADVAIR) 250-50 MCG/DOSE AEPB, Inhale 1 puff into the lungs 2 (two) times daily., Disp: , Rfl:    ibuprofen (ADVIL) 800 MG tablet, Take 1 tablet (800 mg total) by mouth every 8 (eight) hours as needed., Disp: 30 tablet, Rfl: 0   loratadine (CLARITIN) 10 MG tablet, Take 1 tablet (10 mg total) by mouth daily., Disp: 30 tablet, Rfl: 11   potassium chloride (K-DUR) 10 MEQ tablet, Take 10  mEq by mouth daily., Disp: , Rfl:    pravastatin (PRAVACHOL) 40 MG tablet, Take 1 tablet (40 mg total) by mouth daily., Disp: 90 tablet, Rfl: 3   propranolol (INDERAL) 20 MG tablet, TAKE 1 TABLET BY MOUTH TWICE A DAY, Disp: 60 tablet, Rfl: 3   ranitidine (ZANTAC) 150 MG tablet, Take 150 mg by mouth 2 (two) times daily., Disp: , Rfl:    traZODone (DESYREL) 100 MG tablet, TAKE 1 TABLET BY MOUTH AT BEDTIME, Disp: 30 tablet, Rfl: 3   triamterene-hydrochlorothiazide (MAXZIDE-25) 37.5-25 MG tablet, TAKE 1 TABLET BY MOUTH DAILY, Disp: 30 tablet, Rfl: 6   Vitamin D, Ergocalciferol, (DRISDOL) 50000 UNITS CAPS capsule, Take 50,000 Units by mouth every 7 (seven) days. (Patient not taking: Reported on 06/06/2021), Disp: , Rfl:    No Known Allergies  ROS Review of Systems  Constitutional: Negative.   HENT: Negative.    Eyes: Negative.   Respiratory:  Negative.    Cardiovascular: Negative.   Gastrointestinal: Negative.   Endocrine: Negative.   Genitourinary: Negative.   Musculoskeletal: Negative.        Pain rt elbow  Skin: Negative.   Allergic/Immunologic: Negative.   Neurological: Negative.   Hematological: Negative.   Psychiatric/Behavioral: Negative.    All other systems reviewed and are negative.    Objective:    Physical Exam Vitals reviewed.  Constitutional:      Appearance: Normal appearance.  HENT:     Mouth/Throat:     Mouth: Mucous membranes are moist.  Eyes:     Pupils: Pupils are equal, round, and reactive to light.  Neck:     Vascular: No carotid bruit.  Cardiovascular:     Rate and Rhythm: Normal rate and regular rhythm.     Pulses: Normal pulses.     Heart sounds: Normal heart sounds.  Pulmonary:     Effort: Pulmonary effort is normal.     Breath sounds: Normal breath sounds.  Abdominal:     General: Bowel sounds are normal.     Palpations: Abdomen is soft. There is no hepatomegaly, splenomegaly or mass.     Tenderness: There is no abdominal tenderness.     Hernia: No hernia is present.  Musculoskeletal:     Cervical back: Neck supple.     Right lower leg: No edema.     Left lower leg: No edema.     Comments: Tender rt epicondyle  Skin:    Findings: No rash.  Neurological:     Mental Status: He is alert and oriented to person, place, and time.     Motor: No weakness.  Psychiatric:        Mood and Affect: Mood normal.        Behavior: Behavior normal.    BP (!) 143/98   Pulse 66   Ht 6' (1.829 m)   Wt 187 lb 9.6 oz (85.1 kg)   BMI 25.44 kg/m  Wt Readings from Last 3 Encounters:  06/17/21 187 lb 9.6 oz (85.1 kg)  04/29/21 186 lb 1.6 oz (84.4 kg)  10/25/20 187 lb (84.8 kg)     Health Maintenance Due  Topic Date Due   HIV Screening  Never done   Hepatitis C Screening  Never done   TETANUS/TDAP  Never done   COLONOSCOPY (Pts 45-35yrs Insurance coverage will need to be confirmed)   Never done   Zoster Vaccines- Shingrix (1 of 2) Never done   COVID-19 Vaccine (2 - Pfizer series) 08/21/2020  INFLUENZA VACCINE  06/09/2021    There are no preventive care reminders to display for this patient.  No results found for: TSH Lab Results  Component Value Date   WBC 6.2 11/10/2014   HGB 13.3 11/10/2014   HCT 41.2 11/10/2014   MCV 92 11/10/2014   PLT 203 11/10/2014   Lab Results  Component Value Date   NA 139 11/10/2014   K 3.8 11/10/2014   CO2 28 11/10/2014   GLUCOSE 88 11/10/2014   BUN 12 11/10/2014   CREATININE 1.46 (H) 11/10/2014   BILITOT 0.3 03/07/2014   ALKPHOS 93 03/07/2014   AST 42 (H) 03/07/2014   ALT 29 03/07/2014   PROT 7.7 03/07/2014   ALBUMIN 4.0 03/07/2014   CALCIUM 8.3 (L) 11/10/2014   ANIONGAP 5 (L) 11/10/2014   No results found for: CHOL No results found for: HDL No results found for: LDLCALC No results found for: TRIG No results found for: CHOLHDL No results found for: ZTIW5Y    Assessment & Plan:   Problem List Items Addressed This Visit       Cardiovascular and Mediastinum   Essential hypertension - Primary     Patient denies any chest pain or shortness of breath there is no history of palpitation or paroxysmal nocturnal dyspnea   patient was advised to follow low-salt low-cholesterol diet    ideally I want to keep systolic blood pressure below 099 mmHg, patient was asked to check blood pressure one times a week and give me a report on that.  Patient will be follow-up in 3 months  or earlier as needed, patient will call me back for any change in the cardiovascular symptoms Patient was advised to buy a book from local bookstore concerning blood pressure and read several chapters  every day.  This will be supplemented by some of the material we will give him from the office.  Patient should also utilize other resources like YouTube and Internet to learn more about the blood pressure and the diet.         Musculoskeletal and  Integument   Lateral epicondylitis of right elbow    Patient has a tennis elbow on the right side, he was started on Mobic.       Relevant Medications   meloxicam (MOBIC) 7.5 MG tablet     Other   Dyslipidemia (high LDL; low HDL)    Hypercholesterolemia  I advised the patient to follow Mediterranean diet This diet is rich in fruits vegetables and whole grain, and This diet is also rich in fish and lean meat Patient should also eat a handful of almonds or walnuts daily Recent heart study indicated that average follow-up on this kind of diet reduces the cardiovascular mortality by 50 to 70%==       Tobacco abuse counseling    Patient has quit smoking       Primary insomnia    Resolved        Meds ordered this encounter  Medications   meloxicam (MOBIC) 7.5 MG tablet    Sig: Take 1 tablet (7.5 mg total) by mouth daily.    Dispense:  30 tablet    Refill:  0    Follow-up: No follow-ups on file.    Corky Downs, MD

## 2021-06-17 NOTE — Assessment & Plan Note (Signed)
Patient has quit smoking ?

## 2021-06-17 NOTE — Assessment & Plan Note (Signed)
Patient has a tennis elbow on the right side, he was started on Mobic.

## 2021-06-17 NOTE — Assessment & Plan Note (Signed)
Hypercholesterolemia  I advised the patient to follow Mediterranean diet This diet is rich in fruits vegetables and whole grain, and This diet is also rich in fish and lean meat Patient should also eat a handful of almonds or walnuts daily Recent heart study indicated that average follow-up on this kind of diet reduces the cardiovascular mortality by 50 to 70%== 

## 2021-06-17 NOTE — Assessment & Plan Note (Signed)

## 2021-06-17 NOTE — Assessment & Plan Note (Signed)
Resolved

## 2021-06-18 ENCOUNTER — Telehealth: Payer: Medicare Other

## 2021-06-18 ENCOUNTER — Telehealth: Payer: Self-pay

## 2021-06-18 NOTE — Telephone Encounter (Signed)
Called patient 3 times no answer and no way to leave a message

## 2021-06-24 ENCOUNTER — Ambulatory Visit: Payer: Medicare Other | Admitting: Internal Medicine

## 2021-06-24 ENCOUNTER — Encounter: Payer: Self-pay | Admitting: Internal Medicine

## 2021-06-24 ENCOUNTER — Ambulatory Visit (INDEPENDENT_AMBULATORY_CARE_PROVIDER_SITE_OTHER): Payer: Medicare Other | Admitting: Internal Medicine

## 2021-06-24 ENCOUNTER — Other Ambulatory Visit: Payer: Self-pay

## 2021-06-24 VITALS — BP 142/105 | HR 58 | Ht 72.0 in | Wt 189.6 lb

## 2021-06-24 DIAGNOSIS — E785 Hyperlipidemia, unspecified: Secondary | ICD-10-CM | POA: Diagnosis not present

## 2021-06-24 DIAGNOSIS — Z716 Tobacco abuse counseling: Secondary | ICD-10-CM | POA: Diagnosis not present

## 2021-06-24 DIAGNOSIS — M7711 Lateral epicondylitis, right elbow: Secondary | ICD-10-CM

## 2021-06-24 DIAGNOSIS — F5101 Primary insomnia: Secondary | ICD-10-CM

## 2021-06-24 DIAGNOSIS — I10 Essential (primary) hypertension: Secondary | ICD-10-CM | POA: Diagnosis not present

## 2021-06-24 MED ORDER — LOSARTAN POTASSIUM-HCTZ 50-12.5 MG PO TABS: 1.0000 | ORAL_TABLET | Freq: Every day | ORAL | 2 refills | Status: DC

## 2021-06-24 NOTE — Assessment & Plan Note (Signed)
He is sleeping good

## 2021-06-24 NOTE — Progress Notes (Signed)
Established Patient Office Visit  Subjective:  Patient ID: Trevor Harmon, male    DOB: 1966/01/27  Age: 55 y.o. MRN: [id number]  CC:  Chief Complaint  Patient presents with   Arm Pain    Patient complains of right arm, pt is here to follow up from pain. Patient states it is no better.   , Arm Pain    Paul A Bowe presents for blood pressure check he does not smoke does not drink anymore ,I will change his blood pressure medication.  Past Medical History:  Diagnosis Date   GERD (gastroesophageal reflux disease)    Hypertension     Past Surgical History:  Procedure Laterality Date   kidney stent     OTHER SURGICAL HISTORY     stent kidney    History reviewed. No pertinent family history.  Social History   Socioeconomic History   Marital status: Single    Spouse name: Not on file   Number of children: Not on file   Years of education: Not on file   Highest education level: Not on file  Occupational History   Not on file  Tobacco Use   Smoking status: Former    Packs/day: 1.00    Types: Cigarettes   Smokeless tobacco: Never  Vaping Use   Vaping Use: Never used  Substance and Sexual Activity   Alcohol use: No   Drug use: No   Sexual activity: Not Currently  Other Topics Concern   Not on file  Social History Narrative   Not on file   Social Determinants of Health   Financial Resource Strain: Low Risk    Difficulty of Paying Living Expenses: Not very hard  Food Insecurity: No Food Insecurity   Worried About Running Out of Food in the Last Year: Never true   Ran Out of Food in the Last Year: Never true  Transportation Needs: No Transportation Needs   Lack of Transportation (Medical): No   Lack of Transportation (Non-Medical): No  Physical Activity: Sufficiently Active   Days of Exercise per Week: 7 days   Minutes of Exercise per Session: 40 min  Stress: No Stress Concern Present   Feeling of Stress : Not at all  Social Connections: Moderately Isolated    Frequency of Communication with Friends and Family: Once a week   Frequency of Social Gatherings with Friends and Family: Once a week   Attends Religious Services: More than 4 times per year   Active Member of [employer] or Organizations: Yes   Attends Banker Meetings: Never   Marital Status: Never married  Catering manager Violence: Not At Risk   Fear of Current or Ex-Partner: No   Emotionally Abused: No   Physically Abused: No   Sexually Abused: No     Current Outpatient Medications:    losartan-hydrochlorothiazide (HYZAAR) 50-12.5 MG tablet, Take 1 tablet by mouth daily., Disp: 90 tablet, Rfl: 2   ARIPiprazole (ABILIFY) 10 MG tablet, Take 10 mg by mouth daily., Disp: , Rfl:    Fluticasone-Salmeterol (ADVAIR) 250-50 MCG/DOSE AEPB, Inhale 1 puff into the lungs 2 (two) times daily., Disp: , Rfl:    loratadine (CLARITIN) 10 MG tablet, Take 1 tablet (10 mg total) by mouth daily., Disp: 30 tablet, Rfl: 11   pravastatin (PRAVACHOL) 40 MG tablet, Take 1 tablet (40 mg total) by mouth daily., Disp: 90 tablet, Rfl: 3   propranolol (INDERAL) 20 MG tablet, TAKE 1 TABLET BY MOUTH TWICE A DAY, Disp: 60  tablet, Rfl: 3   ranitidine (ZANTAC) 150 MG tablet, Take 150 mg by mouth 2 (two) times daily., Disp: , Rfl:    traZODone (DESYREL) 100 MG tablet, TAKE 1 TABLET BY MOUTH AT BEDTIME, Disp: 30 tablet, Rfl: 3   Vitamin D, Ergocalciferol, (DRISDOL) 50000 UNITS CAPS capsule, Take 50,000 Units by mouth every 7 (seven) days. (Patient not taking: Reported on 06/06/2021), Disp: , Rfl:    No Known Allergies  ROS Review of Systems  Constitutional: Negative.   HENT: Negative.    Eyes: Negative.   Respiratory: Negative.    Cardiovascular: Negative.   Gastrointestinal: Negative.   Endocrine: Negative.   Genitourinary: Negative.   Musculoskeletal: Negative.   Skin: Negative.   Allergic/Immunologic: Negative.   Neurological: Negative.   Hematological: Negative.   Psychiatric/Behavioral:  Negative.    All other systems reviewed and are negative.    Objective:    Physical Exam Vitals reviewed.  Constitutional:      Appearance: Normal appearance.  HENT:     Mouth/Throat:     Mouth: Mucous membranes are moist.  Eyes:     Pupils: Pupils are equal, round, and reactive to light.  Neck:     Vascular: No carotid bruit.  Cardiovascular:     Rate and Rhythm: Normal rate and regular rhythm.     Pulses: Normal pulses.     Heart sounds: Normal heart sounds.  Pulmonary:     Effort: Pulmonary effort is normal.     Breath sounds: Normal breath sounds.  Abdominal:     General: Bowel sounds are normal.     Palpations: Abdomen is soft. There is no hepatomegaly, splenomegaly or mass.     Tenderness: There is no abdominal tenderness.     Hernia: No hernia is present.  Musculoskeletal:     Cervical back: Neck supple.     Right lower leg: No edema.     Left lower leg: No edema.  Skin:    Findings: No rash.  Neurological:     Mental Status: He is alert and oriented to person, place, and time.     Motor: No weakness.  Psychiatric:        Mood and Affect: Mood normal.        Behavior: Behavior normal.    BP (!) 142/105   Pulse (!) 58   Ht 6' (1.829 m)   Wt 189 lb 9.6 oz (86 kg)   BMI 25.71 kg/m  Wt Readings from Last 3 Encounters:  06/24/21 189 lb 9.6 oz (86 kg)  06/17/21 187 lb 9.6 oz (85.1 kg)  04/29/21 186 lb 1.6 oz (84.4 kg)     Health Maintenance Due  Topic Date Due   HIV Screening  Never done   Hepatitis C Screening  Never done   TETANUS/TDAP  Never done   COLONOSCOPY (Pts 45-47yrs Insurance coverage will need to be confirmed)  Never done   Zoster Vaccines- Shingrix (1 of 2) Never done   COVID-19 Vaccine (2 - Pfizer series) 08/21/2020   INFLUENZA VACCINE  06/09/2021    There are no preventive care reminders to display for this patient.  No results found for: TSH Lab Results  Component Value Date   WBC 6.2 11/10/2014   HGB 13.3 11/10/2014   HCT  41.2 11/10/2014   MCV 92 11/10/2014   PLT 203 11/10/2014   Lab Results  Component Value Date   NA 139 11/10/2014   K 3.8 11/10/2014   CO2 28 11/10/2014  GLUCOSE 88 11/10/2014   BUN 12 11/10/2014   CREATININE 1.46 (H) 11/10/2014   BILITOT 0.3 03/07/2014   ALKPHOS 93 03/07/2014   AST 42 (H) 03/07/2014   ALT 29 03/07/2014   PROT 7.7 03/07/2014   ALBUMIN 4.0 03/07/2014   CALCIUM 8.3 (L) 11/10/2014   ANIONGAP 5 (L) 11/10/2014   No results found for: CHOL No results found for: HDL No results found for: LDLCALC No results found for: TRIG No results found for: CHOLHDL No results found for: KTGY5W    Assessment & Plan:   Problem List Items Addressed This Visit       Cardiovascular and Mediastinum   Essential hypertension - Primary     Patient denies any chest pain or shortness of breath there is no history of palpitation or paroxysmal nocturnal dyspnea   patient was advised to follow low-salt low-cholesterol diet    ideally I want to keep systolic blood pressure below 389 mmHg, patient was asked to check blood pressure one times a week and give me a report on that.  Patient will be follow-up in 3 months  or earlier as needed, patient will call me back for any change in the cardiovascular symptoms Patient was advised to buy a book from local bookstore concerning blood pressure and read several chapters  every day.  This will be supplemented by some of the material we will give him from the office.  Patient should also utilize other resources like YouTube and Internet to learn more about the blood pressure and the diet.      Relevant Medications   losartan-hydrochlorothiazide (HYZAAR) 50-12.5 MG tablet     Musculoskeletal and Integument   Lateral epicondylitis of right elbow    Is much better        Other   Dyslipidemia (high LDL; low HDL)    Hypercholesterolemia  I advised the patient to follow Mediterranean diet This diet is rich in fruits vegetables and whole grain,  and This diet is also rich in fish and lean meat Patient should also eat a handful of almonds or walnuts daily Recent heart study indicated that average follow-up on this kind of diet reduces the cardiovascular mortality by 50 to 70%==      Tobacco abuse counseling    Patient does not smoke anymore      Primary insomnia    He is sleeping good       Meds ordered this encounter  Medications   losartan-hydrochlorothiazide (HYZAAR) 50-12.5 MG tablet    Sig: Take 1 tablet by mouth daily.    Dispense:  90 tablet    Refill:  2    Follow-up: No follow-ups on file.    Corky Downs, MD

## 2021-06-24 NOTE — Assessment & Plan Note (Signed)
Patient does not smoke anymore ?

## 2021-06-24 NOTE — Assessment & Plan Note (Signed)

## 2021-06-24 NOTE — Assessment & Plan Note (Signed)
Is much better. 

## 2021-06-24 NOTE — Assessment & Plan Note (Signed)
Hypercholesterolemia  I advised the patient to follow Mediterranean diet This diet is rich in fruits vegetables and whole grain, and This diet is also rich in fish and lean meat Patient should also eat a handful of almonds or walnuts daily Recent heart study indicated that average follow-up on this kind of diet reduces the cardiovascular mortality by 50 to 70%== 

## 2021-07-07 ENCOUNTER — Ambulatory Visit: Payer: Medicare Other | Admitting: Internal Medicine

## 2021-09-08 ENCOUNTER — Ambulatory Visit (INDEPENDENT_AMBULATORY_CARE_PROVIDER_SITE_OTHER): Payer: Medicare Other | Admitting: *Deleted

## 2021-09-08 DIAGNOSIS — Z23 Encounter for immunization: Secondary | ICD-10-CM | POA: Diagnosis not present

## 2021-09-29 ENCOUNTER — Other Ambulatory Visit: Payer: Self-pay | Admitting: Internal Medicine

## 2021-11-20 ENCOUNTER — Other Ambulatory Visit: Payer: Self-pay | Admitting: Internal Medicine

## 2021-12-01 ENCOUNTER — Other Ambulatory Visit: Payer: Self-pay | Admitting: Internal Medicine

## 2021-12-03 ENCOUNTER — Other Ambulatory Visit: Payer: Self-pay

## 2021-12-03 DIAGNOSIS — Z1211 Encounter for screening for malignant neoplasm of colon: Secondary | ICD-10-CM

## 2021-12-23 ENCOUNTER — Encounter: Payer: Self-pay | Admitting: Internal Medicine

## 2021-12-23 ENCOUNTER — Other Ambulatory Visit: Payer: Self-pay

## 2021-12-23 ENCOUNTER — Ambulatory Visit (INDEPENDENT_AMBULATORY_CARE_PROVIDER_SITE_OTHER): Payer: Medicare Other | Admitting: Internal Medicine

## 2021-12-23 VITALS — BP 130/84 | HR 74 | Ht 72.0 in | Wt 175.1 lb

## 2021-12-23 DIAGNOSIS — I10 Essential (primary) hypertension: Secondary | ICD-10-CM | POA: Diagnosis not present

## 2021-12-23 DIAGNOSIS — F5101 Primary insomnia: Secondary | ICD-10-CM

## 2021-12-23 DIAGNOSIS — Z716 Tobacco abuse counseling: Secondary | ICD-10-CM

## 2021-12-23 DIAGNOSIS — R197 Diarrhea, unspecified: Secondary | ICD-10-CM | POA: Diagnosis not present

## 2021-12-23 NOTE — Assessment & Plan Note (Signed)
Stable at the present time, patient was advised to avoid coffee

## 2021-12-23 NOTE — Assessment & Plan Note (Signed)
-   I instructed the patient to stop smoking and provided them with smoking cessation materials.  - I informed the patient that smoking puts them at increased risk for cancer, COPD, hypertension, and more.  - Informed the patient to seek help if they begin to have trouble breathing, develop chest pain, start to cough up blood, feel faint, or pass out.  

## 2021-12-23 NOTE — Assessment & Plan Note (Signed)
Patient was advised to take Imodium as needed and Gatorade

## 2021-12-23 NOTE — Assessment & Plan Note (Signed)

## 2021-12-23 NOTE — Progress Notes (Signed)
Established Patient Office Visit  Subjective:  Patient ID: Trevor Harmon, male    DOB: 1966-09-23  Age: 56 y.o. MRN: [id number]  CC:  Chief Complaint  Patient presents with   Elbow Pain    Patient having left elbow pain. Patient states that pains started yesterday. Patient states that pain is only when he uses that arm. Pains has not improved since yesterday.    Diarrhea  This is a new problem. The current episode started yesterday. The problem occurs 2 to 4 times per day. The problem has been gradually improving. The patient states that diarrhea does not awaken him from sleep. Pertinent negatives include no abdominal pain, bloating, chills, coughing, headaches, increased  flatus or vomiting. There are no known risk factors. He has tried nothing for the symptoms.   [name] presents for diarrhea  Past Medical History:  Diagnosis Date   GERD (gastroesophageal reflux disease)    Hypertension     Past Surgical History:  Procedure Laterality Date   kidney stent     OTHER SURGICAL HISTORY     stent kidney    History reviewed. No pertinent family history.  Social History   Socioeconomic History   Marital status: Single    Spouse name: Not on file   Number of children: Not on file   Years of education: Not on file   Highest education level: Not on file  Occupational History   Not on file  Tobacco Use   Smoking status: Former    Packs/day: 1.00    Types: Cigarettes   Smokeless tobacco: Never  Vaping Use   Vaping Use: Never used  Substance and Sexual Activity   Alcohol use: No   Drug use: No   Sexual activity: Not Currently  Other Topics Concern   Not on file  Social History Narrative   Not on file   Social Determinants of Health   Financial Resource Strain: Low Risk    Difficulty of Paying Living Expenses: Not very hard  Food Insecurity: No Food Insecurity   Worried About Running Out of Food in the Last Year: Never true   Ran Out of Food in the Last Year:  Never true  Transportation Needs: No Transportation Needs   Lack of Transportation (Medical): No   Lack of Transportation (Non-Medical): No  Physical Activity: Sufficiently Active   Days of Exercise per Week: 7 days   Minutes of Exercise per Session: 40 min  Stress: No Stress Concern Present   Feeling of Stress : Not at all  Social Connections: Moderately Isolated   Frequency of Communication with Friends and Family: Once a week   Frequency of Social Gatherings with Friends and Family: Once a week   Attends Religious Services: More than 4 times per year   Active Member of [employer] or Organizations: Yes   Attends Banker Meetings: Never   Marital Status: Never married  Catering manager Violence: Not At Risk   Fear of Current or Ex-Partner: No   Emotionally Abused: No   Physically Abused: No   Sexually Abused: No     Current Outpatient Medications:    ARIPiprazole (ABILIFY) 10 MG tablet, Take 10 mg by mouth daily., Disp: , Rfl:    Fluticasone-Salmeterol (ADVAIR) 250-50 MCG/DOSE AEPB, Inhale 1 puff into the lungs 2 (two) times daily., Disp: , Rfl:    loratadine (CLARITIN) 10 MG tablet, Take 1 tablet (10 mg total) by mouth daily., Disp: 30 tablet, Rfl: 11  losartan-hydrochlorothiazide (HYZAAR) 50-12.5 MG tablet, Take 1 tablet by mouth daily., Disp: 90 tablet, Rfl: 2   pravastatin (PRAVACHOL) 40 MG tablet, TAKE 1 TABLET BY MOUTH DAILY, Disp: 90 tablet, Rfl: 3   propranolol (INDERAL) 20 MG tablet, TAKE 1 TABLET BY MOUTH TWICE A DAY, Disp: 60 tablet, Rfl: 3   ranitidine (ZANTAC) 150 MG tablet, Take 150 mg by mouth 2 (two) times daily., Disp: , Rfl:    traZODone (DESYREL) 100 MG tablet, TAKE 1 TABLET BY MOUTH AT BEDTIME, Disp: 30 tablet, Rfl: 3   Vitamin D, Ergocalciferol, (DRISDOL) 50000 UNITS CAPS capsule, Take 50,000 Units by mouth every 7 (seven) days. (Patient not taking: Reported on 06/06/2021), Disp: , Rfl:    No Known Allergies  ROS Review of Systems  Constitutional:   Negative for chills.  Respiratory:  Negative for cough.   Gastrointestinal:  Positive for diarrhea. Negative for abdominal pain, bloating, flatus and vomiting.  Neurological:  Negative for headaches.     Objective:    Physical Exam Vitals reviewed.  Constitutional:      Appearance: Normal appearance.  HENT:     Mouth/Throat:     Mouth: Mucous membranes are moist.  Eyes:     Pupils: Pupils are equal, round, and reactive to light.  Neck:     Vascular: No carotid bruit.  Cardiovascular:     Rate and Rhythm: Normal rate and regular rhythm.     Pulses: Normal pulses.     Heart sounds: Normal heart sounds.  Pulmonary:     Effort: Pulmonary effort is normal.     Breath sounds: Normal breath sounds.  Abdominal:     General: Bowel sounds are normal.     Palpations: Abdomen is soft. There is no hepatomegaly, splenomegaly or mass.     Tenderness: There is no abdominal tenderness.     Hernia: No hernia is present.  Musculoskeletal:     Cervical back: Neck supple.     Right lower leg: No edema.     Left lower leg: No edema.  Skin:    Findings: No rash.  Neurological:     Mental Status: He is alert and oriented to person, place, and time.     Motor: No weakness.  Psychiatric:        Mood and Affect: Mood normal.        Behavior: Behavior normal.    BP 130/84    Pulse 74    Ht 6' (1.829 m)    Wt 175 lb 1.6 oz (79.4 kg)    BMI 23.75 kg/m  Wt Readings from Last 3 Encounters:  12/23/21 175 lb 1.6 oz (79.4 kg)  06/24/21 189 lb 9.6 oz (86 kg)  06/17/21 187 lb 9.6 oz (85.1 kg)     Health Maintenance Due  Topic Date Due   HIV Screening  Never done   Hepatitis C Screening  Never done   COLONOSCOPY (Pts 45-22yrs Insurance coverage will need to be confirmed)  Never done   Zoster Vaccines- Shingrix (1 of 2) Never done   COVID-19 Vaccine (2 - Pfizer series) 08/21/2020    There are no preventive care reminders to display for this patient.  No results found for: TSH Lab Results   Component Value Date   WBC 6.2 11/10/2014   HGB 13.3 11/10/2014   HCT 41.2 11/10/2014   MCV 92 11/10/2014   PLT 203 11/10/2014   Lab Results  Component Value Date   NA 139 11/10/2014  K 3.8 11/10/2014   CO2 28 11/10/2014   GLUCOSE 88 11/10/2014   BUN 12 11/10/2014   CREATININE 1.46 (H) 11/10/2014   BILITOT 0.3 03/07/2014   ALKPHOS 93 03/07/2014   AST 42 (H) 03/07/2014   ALT 29 03/07/2014   PROT 7.7 03/07/2014   ALBUMIN 4.0 03/07/2014   CALCIUM 8.3 (L) 11/10/2014   ANIONGAP 5 (L) 11/10/2014   No results found for: CHOL No results found for: HDL No results found for: LDLCALC No results found for: TRIG No results found for: CHOLHDL No results found for: JEHU3J    Assessment & Plan:   Problem List Items Addressed This Visit       Cardiovascular and Mediastinum   Essential hypertension - Primary     Patient denies any chest pain or shortness of breath there is no history of palpitation or paroxysmal nocturnal dyspnea   patient was advised to follow low-salt low-cholesterol diet    ideally I want to keep systolic blood pressure below 497 mmHg, patient was asked to check blood pressure one times a week and give me a report on that.  Patient will be follow-up in 3 months  or earlier as needed, patient will call me back for any change in the cardiovascular symptoms Patient was advised to buy a book from local bookstore concerning blood pressure and read several chapters  every day.  This will be supplemented by some of the material we will give him from the office.  Patient should also utilize other resources like YouTube and Internet to learn more about the blood pressure and the diet.        Other   Tobacco abuse counseling    - I instructed the patient to stop smoking and provided them with smoking cessation materials.  - I informed the patient that smoking puts them at increased risk for cancer, COPD, hypertension, and more.  - Informed the patient to seek help if  they begin to have trouble breathing, develop chest pain, start to cough up blood, feel faint, or pass out.      Primary insomnia    Stable at the present time, patient was advised to avoid coffee      Diarrhea    Patient was advised to take Imodium as needed and Gatorade       No orders of the defined types were placed in this encounter.   Follow-up: No follow-ups on file.    Corky Downs, MD

## 2022-03-02 ENCOUNTER — Other Ambulatory Visit: Payer: Self-pay | Admitting: Internal Medicine

## 2022-03-09 ENCOUNTER — Encounter: Payer: Self-pay | Admitting: Internal Medicine

## 2022-03-09 ENCOUNTER — Ambulatory Visit (INDEPENDENT_AMBULATORY_CARE_PROVIDER_SITE_OTHER): Payer: Medicare Other | Admitting: Internal Medicine

## 2022-03-09 VITALS — BP 146/94 | HR 60 | Ht 72.0 in | Wt 190.5 lb

## 2022-03-09 DIAGNOSIS — I10 Essential (primary) hypertension: Secondary | ICD-10-CM | POA: Diagnosis not present

## 2022-03-09 DIAGNOSIS — F5101 Primary insomnia: Secondary | ICD-10-CM | POA: Diagnosis not present

## 2022-03-09 DIAGNOSIS — Z716 Tobacco abuse counseling: Secondary | ICD-10-CM

## 2022-03-09 DIAGNOSIS — E785 Hyperlipidemia, unspecified: Secondary | ICD-10-CM

## 2022-03-09 DIAGNOSIS — M545 Low back pain, unspecified: Secondary | ICD-10-CM | POA: Diagnosis not present

## 2022-03-09 MED ORDER — CYCLOBENZAPRINE HCL 5 MG PO TABS: 5.0000 mg | ORAL_TABLET | Freq: Three times a day (TID) | ORAL | 1 refills | Status: DC | PRN

## 2022-03-09 NOTE — Assessment & Plan Note (Signed)
Insomnia is much better than normal ? ? ?

## 2022-03-09 NOTE — Assessment & Plan Note (Signed)
-   Patient's back pain is under control with medication.  - Encouraged the patient to stretch or do yoga as able to help with back pain 

## 2022-03-09 NOTE — Progress Notes (Signed)
? ?Established Patient Office Visit ? ?Subjective:  ?Patient ID: Trevor Harmon, male    DOB: 1966/09/19  Age: 56 y.o. MRN: [id number] ? ?CC:  ?Chief Complaint  ?Patient presents with  ? Back Pain  ?  Patient having back pain/spasm after helping someone move furniture   ? ? ?Back Pain ?This is a new problem. The problem has been waxing and waning since onset. The pain is present in the sacro-iliac. The pain is at a severity of 5/10. Pertinent negatives include no abdominal pain, bladder incontinence, chest pain, dysuria, fever, headaches, leg pain, numbness, paresthesias, tingling or weakness.  ? ?Trevor Harmon presents for back pain ? ?Past Medical History:  ?Diagnosis Date  ? GERD (gastroesophageal reflux disease)   ? Hypertension   ? ? ?Past Surgical History:  ?Procedure Laterality Date  ? kidney stent    ? OTHER SURGICAL HISTORY    ? stent kidney  ? ? ?History reviewed. No pertinent family history. ? ?Social History  ? ?Socioeconomic History  ? Marital status: Single  ?  Spouse name: Not on file  ? Number of children: Not on file  ? Years of education: Not on file  ? Highest education level: Not on file  ?Occupational History  ? Not on file  ?Tobacco Use  ? Smoking status: Former  ?  Packs/day: 1.00  ?  Types: Cigarettes  ? Smokeless tobacco: Never  ?Vaping Use  ? Vaping Use: Never used  ?Substance and Sexual Activity  ? Alcohol use: No  ? Drug use: No  ? Sexual activity: Not Currently  ?Other Topics Concern  ? Not on file  ?Social History Narrative  ? Not on file  ? ?Social Determinants of Health  ? ?Financial Resource Strain: Low Risk   ? Difficulty of Paying Living Expenses: Not very hard  ?Food Insecurity: No Food Insecurity  ? Worried About Programme researcher, broadcasting/film/video in the Last Year: Never true  ? Ran Out of Food in the Last Year: Never true  ?Transportation Needs: No Transportation Needs  ? Lack of Transportation (Medical): No  ? Lack of Transportation (Non-Medical): No  ?Physical Activity: Sufficiently Active  ? Days  of Exercise per Week: 7 days  ? Minutes of Exercise per Session: 40 min  ?Stress: No Stress Concern Present  ? Feeling of Stress : Not at all  ?Social Connections: Moderately Isolated  ? Frequency of Communication with Friends and Family: Once a week  ? Frequency of Social Gatherings with Friends and Family: Once a week  ? Attends Religious Services: More than 4 times per year  ? Active Member of Clubs or Organizations: Yes  ? Attends Banker Meetings: Never  ? Marital Status: Never married  ?Intimate Partner Violence: Not At Risk  ? Fear of Current or Ex-Partner: No  ? Emotionally Abused: No  ? Physically Abused: No  ? Sexually Abused: No  ? ? ? ?Current Outpatient Medications:  ?  ARIPiprazole (ABILIFY) 10 MG tablet, Take 10 mg by mouth daily., Disp: , Rfl:  ?  Fluticasone-Salmeterol (ADVAIR) 250-50 MCG/DOSE AEPB, Inhale 1 puff into the lungs 2 (two) times daily., Disp: , Rfl:  ?  loratadine (CLARITIN) 10 MG tablet, Take 1 tablet (10 mg total) by mouth daily., Disp: 30 tablet, Rfl: 11 ?  losartan-hydrochlorothiazide (HYZAAR) 50-12.5 MG tablet, Take 1 tablet by mouth daily., Disp: 90 tablet, Rfl: 2 ?  pravastatin (PRAVACHOL) 40 MG tablet, TAKE 1 TABLET BY MOUTH DAILY, Disp: 90  tablet, Rfl: 3 ?  propranolol (INDERAL) 20 MG tablet, TAKE 1 TABLET BY MOUTH TWICE A DAY, Disp: 60 tablet, Rfl: 3 ?  ranitidine (ZANTAC) 150 MG tablet, Take 150 mg by mouth 2 (two) times daily., Disp: , Rfl:  ?  traZODone (DESYREL) 100 MG tablet, TAKE 1 TABLET BY MOUTH AT BEDTIME, Disp: 30 tablet, Rfl: 3 ?  triamterene-hydrochlorothiazide (MAXZIDE-25) 37.5-25 MG tablet, TAKE 1 TABLET BY MOUTH DAILY, Disp: 30 tablet, Rfl: 4 ?  Vitamin D, Ergocalciferol, (DRISDOL) 50000 UNITS CAPS capsule, Take 50,000 Units by mouth every 7 (seven) days. (Patient not taking: Reported on 06/06/2021), Disp: , Rfl:   ? ?No Known Allergies ? ?ROS ?Review of Systems  ?Constitutional:  Negative for fever.  ?Cardiovascular:  Negative for chest pain.   ?Gastrointestinal:  Negative for abdominal pain.  ?Genitourinary:  Negative for bladder incontinence and dysuria.  ?Musculoskeletal:  Positive for back pain.  ?Neurological:  Negative for tingling, weakness, numbness, headaches and paresthesias.  ? ?  ?Objective:  ?  ?Physical Exam ?Vitals reviewed.  ?Constitutional:   ?   Appearance: Normal appearance.  ?HENT:  ?   Mouth/Throat:  ?   Mouth: Mucous membranes are moist.  ?Eyes:  ?   Pupils: Pupils are equal, round, and reactive to light.  ?Neck:  ?   Vascular: No carotid bruit.  ?Cardiovascular:  ?   Rate and Rhythm: Normal rate and regular rhythm.  ?   Pulses: Normal pulses.  ?   Heart sounds: Normal heart sounds.  ?Pulmonary:  ?   Effort: Pulmonary effort is normal.  ?   Breath sounds: Normal breath sounds.  ?Abdominal:  ?   General: Bowel sounds are normal.  ?   Palpations: Abdomen is soft. There is no hepatomegaly, splenomegaly or mass.  ?   Tenderness: There is no abdominal tenderness.  ?   Hernia: No hernia is present.  ?Musculoskeletal:  ?   Cervical back: Neck supple.  ?   Right lower leg: No edema.  ?   Left lower leg: No edema.  ?Skin: ?   Findings: No rash.  ?Neurological:  ?   Mental Status: He is alert and oriented to person, place, and time.  ?   Motor: No weakness.  ?Psychiatric:     ?   Mood and Affect: Mood normal.     ?   Behavior: Behavior normal.  ? ? ?BP (!) 146/94   Pulse 60   Ht 6' (1.829 m)   Wt 190 lb 8 oz (86.4 kg)   BMI 25.84 kg/m?  ?Wt Readings from Last 3 Encounters:  ?03/09/22 190 lb 8 oz (86.4 kg)  ?12/23/21 175 lb 1.6 oz (79.4 kg)  ?06/24/21 189 lb 9.6 oz (86 kg)  ? ? ? ?Health Maintenance Due  ?Topic Date Due  ? HIV Screening  Never done  ? Hepatitis C Screening  Never done  ? COLONOSCOPY (Pts 45-38yrs Insurance coverage will need to be confirmed)  Never done  ? Zoster Vaccines- Shingrix (1 of 2) Never done  ? COVID-19 Vaccine (2 - Pfizer series) 08/21/2020  ? ? ?There are no preventive care reminders to display for this  patient. ? ?No results found for: TSH ?Lab Results  ?Component Value Date  ? WBC 6.2 11/10/2014  ? HGB 13.3 11/10/2014  ? HCT 41.2 11/10/2014  ? MCV 92 11/10/2014  ? PLT 203 11/10/2014  ? ?Lab Results  ?Component Value Date  ? NA 139 11/10/2014  ? K 3.8  11/10/2014  ? CO2 28 11/10/2014  ? GLUCOSE 88 11/10/2014  ? BUN 12 11/10/2014  ? CREATININE 1.46 (H) 11/10/2014  ? BILITOT 0.3 03/07/2014  ? ALKPHOS 93 03/07/2014  ? AST 42 (H) 03/07/2014  ? ALT 29 03/07/2014  ? PROT 7.7 03/07/2014  ? ALBUMIN 4.0 03/07/2014  ? CALCIUM 8.3 (L) 11/10/2014  ? ANIONGAP 5 (L) 11/10/2014  ? ?No results found for: CHOL ?No results found for: HDL ?No results found for: LDLCALC ?No results found for: TRIG ?No results found for: CHOLHDL ?No results found for: HGBA1C ? ?  ?Assessment & Plan:  ? ?Problem List Items Addressed This Visit   ? ?  ? Cardiovascular and Mediastinum  ? Essential hypertension - Primary  ?   Patient denies any chest pain or shortness of breath there is no history of palpitation or paroxysmal nocturnal dyspnea ?  patient was advised to follow low-salt low-cholesterol diet ? ?  ideally I want to keep systolic blood pressure below 161130 mmHg, patient was asked to check blood pressure one times a week and give me a report on that.  Patient will be follow-up in 3 months  or earlier as needed, patient will call me back for any change in the cardiovascular symptoms ?Patient was advised to buy a book from local bookstore concerning blood pressure and read several chapters  every day.  This will be supplemented by some of the material we will give him from the office.  Patient should also utilize other resources like YouTube and Internet to learn more about the blood pressure and the diet. ? ?  ?  ?  ? Other  ? Dyslipidemia (high LDL; low HDL)  ?  Hypercholesterolemia ? ?I advised the patient to follow Mediterranean diet ?This diet is rich in fruits vegetables and whole grain, and ?This diet is also rich in fish and lean  meat ?Patient should also eat a handful of almonds or walnuts daily ?Recent heart study indicated that average follow-up on this kind of diet reduces the cardiovascular mortality by 50 to 70%== ? ?  ?  ? Tobacco abuse counseling

## 2022-03-09 NOTE — Assessment & Plan Note (Signed)

## 2022-03-09 NOTE — Assessment & Plan Note (Signed)
Patient has quit smoking ?

## 2022-03-09 NOTE — Assessment & Plan Note (Signed)
Hypercholesterolemia  I advised the patient to follow Mediterranean diet This diet is rich in fruits vegetables and whole grain, and This diet is also rich in fish and lean meat Patient should also eat a handful of almonds or walnuts daily Recent heart study indicated that average follow-up on this kind of diet reduces the cardiovascular mortality by 50 to 70%== 

## 2022-04-09 ENCOUNTER — Ambulatory Visit (INDEPENDENT_AMBULATORY_CARE_PROVIDER_SITE_OTHER): Payer: Medicare Other | Admitting: Nurse Practitioner

## 2022-04-09 ENCOUNTER — Encounter: Payer: Self-pay | Admitting: Nurse Practitioner

## 2022-04-09 VITALS — BP 140/90 | HR 58 | Ht 72.0 in | Wt 199.0 lb

## 2022-04-09 DIAGNOSIS — M79602 Pain in left arm: Secondary | ICD-10-CM

## 2022-04-09 MED ORDER — DICLOFENAC SODIUM 1 % EX GEL: 2.0000 g | Freq: Two times a day (BID) | CUTANEOUS | 1 refills | Status: AC

## 2022-04-09 MED ORDER — IBUPROFEN 800 MG PO TABS: 800.0000 mg | ORAL_TABLET | Freq: Three times a day (TID) | ORAL | 0 refills | Status: DC | PRN

## 2022-04-09 NOTE — Assessment & Plan Note (Addendum)
Started him on ibuprofen 800 mg every 8 hours as needed. Apply diclofenac gel twice a day. Gave him the note for work.  Advised patient to call the office is symptoms fails to improve.

## 2022-04-09 NOTE — Progress Notes (Signed)
Established Patient Office Visit  Subjective:  Patient ID: Trevor Harmon, male    DOB: 03/18/1966  Age: 56 y.o. MRN: [id number]  CC:  Chief Complaint  Patient presents with   Arm Pain    Patient complains of left arm pain for 3 days. Pain is worse at night time when patient is laying in the bed. Mr. Allean Found states there is no injury that caused the pain.      HPI  Trevor Harmon presents for:  Arm Pain  The incident occurred 5 to 7 days ago. The incident occurred at home. There was no injury mechanism. Pain location: antecubital area left hand. The quality of the pain is described as aching. The pain does not radiate. The pain is at a severity of 6/10. The pain is moderate. The pain has been Constant since the incident. Associated symptoms include muscle weakness. Pertinent negatives include no chest pain, numbness or tingling. The symptoms are aggravated by lifting. He has tried nothing for the symptoms.  Patient reports the pain started 3 days ago while he woke up from sleep. He denies any trauma. His back pain is better now.   Past Medical History:  Diagnosis Date   GERD (gastroesophageal reflux disease)    Hypertension     Past Surgical History:  Procedure Laterality Date   kidney stent     OTHER SURGICAL HISTORY     stent kidney    History reviewed. No pertinent family history.  Social History   Socioeconomic History   Marital status: Single    Spouse name: Not on file   Number of children: Not on file   Years of education: Not on file   Highest education level: Not on file  Occupational History   Not on file  Tobacco Use   Smoking status: Former    Packs/day: 1.00    Types: Cigarettes   Smokeless tobacco: Never  Vaping Use   Vaping Use: Never used  Substance and Sexual Activity   Alcohol use: No   Drug use: No   Sexual activity: Not Currently  Other Topics Concern   Not on file  Social History Narrative   Not on file   Social Determinants of Health    Financial Resource Strain: Low Risk    Difficulty of Paying Living Expenses: Not very hard  Food Insecurity: No Food Insecurity   Worried About Running Out of Food in the Last Year: Never true   Ran Out of Food in the Last Year: Never true  Transportation Needs: No Transportation Needs   Lack of Transportation (Medical): No   Lack of Transportation (Non-Medical): No  Physical Activity: Sufficiently Active   Days of Exercise per Week: 7 days   Minutes of Exercise per Session: 40 min  Stress: No Stress Concern Present   Feeling of Stress : Not at all  Social Connections: Moderately Isolated   Frequency of Communication with Friends and Family: Once a week   Frequency of Social Gatherings with Friends and Family: Once a week   Attends Religious Services: More than 4 times per year   Active Member of [employer] or Organizations: Yes   Attends Banker Meetings: Never   Marital Status: Never married  Catering manager Violence: Not At Risk   Fear of Current or Ex-Partner: No   Emotionally Abused: No   Physically Abused: No   Sexually Abused: No     Outpatient Medications Prior to Visit  Medication Sig Dispense  Refill   ARIPiprazole (ABILIFY) 10 MG tablet Take 10 mg by mouth daily.     cyclobenzaprine (FLEXERIL) 5 MG tablet Take 1 tablet (5 mg total) by mouth 3 (three) times daily as needed for muscle spasms. 30 tablet 1   Fluticasone-Salmeterol (ADVAIR) 250-50 MCG/DOSE AEPB Inhale 1 puff into the lungs 2 (two) times daily.     loratadine (CLARITIN) 10 MG tablet Take 1 tablet (10 mg total) by mouth daily. 30 tablet 11   losartan-hydrochlorothiazide (HYZAAR) 50-12.5 MG tablet Take 1 tablet by mouth daily. 90 tablet 2   pravastatin (PRAVACHOL) 40 MG tablet TAKE 1 TABLET BY MOUTH DAILY 90 tablet 3   propranolol (INDERAL) 20 MG tablet TAKE 1 TABLET BY MOUTH TWICE A DAY 60 tablet 3   ranitidine (ZANTAC) 150 MG tablet Take 150 mg by mouth 2 (two) times daily.     traZODone  (DESYREL) 100 MG tablet TAKE 1 TABLET BY MOUTH AT BEDTIME 30 tablet 3   triamterene-hydrochlorothiazide (MAXZIDE-25) 37.5-25 MG tablet TAKE 1 TABLET BY MOUTH DAILY 30 tablet 4   Vitamin D, Ergocalciferol, (DRISDOL) 50000 UNITS CAPS capsule Take 50,000 Units by mouth every 7 (seven) days. (Patient not taking: Reported on 06/06/2021)     No facility-administered medications prior to visit.    No Known Allergies  ROS Review of Systems  Constitutional:  Negative for activity change, appetite change and fatigue.  HENT:  Negative for congestion, facial swelling and sinus pain.   Eyes:  Negative for discharge and redness.  Respiratory:  Negative for apnea, shortness of breath and wheezing.   Cardiovascular:  Negative for chest pain and palpitations.  Gastrointestinal:  Negative for abdominal distention, constipation and diarrhea.  Genitourinary:  Negative for difficulty urinating and genital sores.  Musculoskeletal:        Pain in left antecubital area   Skin:  Negative for color change, rash and wound.  Neurological:  Negative for dizziness, tingling and numbness.  Psychiatric/Behavioral:  Negative for agitation, behavioral problems and confusion.      Objective:    Physical Exam Constitutional:      Appearance: Normal appearance. He is obese.  HENT:     Head: Normocephalic and atraumatic.     Right Ear: Tympanic membrane normal.     Left Ear: Tympanic membrane normal.     Nose: Nose normal.     Mouth/Throat:     Mouth: Mucous membranes are moist.     Pharynx: Oropharynx is clear.  Eyes:     Conjunctiva/sclera: Conjunctivae normal.     Pupils: Pupils are equal, round, and reactive to light.  Cardiovascular:     Rate and Rhythm: Normal rate and regular rhythm.     Pulses: Normal pulses.     Heart sounds: Normal heart sounds.  Pulmonary:     Effort: Pulmonary effort is normal.  Abdominal:     General: Abdomen is flat. Bowel sounds are normal.     Palpations: Abdomen is soft.   Musculoskeletal:     Left forearm: Tenderness present.       Arms:     Cervical back: Normal range of motion.  Skin:    General: Skin is warm.     Capillary Refill: Capillary refill takes less than 2 seconds.     Findings: No lesion or rash.  Neurological:     General: No focal deficit present.     Mental Status: He is alert and oriented to person, place, and time. Mental status is at  baseline.  Psychiatric:        Mood and Affect: Mood normal.        Behavior: Behavior normal.        Thought Content: Thought content normal.        Judgment: Judgment normal.    BP 140/90   Pulse (!) 58   Ht 6' (1.829 m)   Wt 199 lb (90.3 kg)   BMI 26.99 kg/m  Wt Readings from Last 3 Encounters:  04/09/22 199 lb (90.3 kg)  03/09/22 190 lb 8 oz (86.4 kg)  12/23/21 175 lb 1.6 oz (79.4 kg)     Health Maintenance Due  Topic Date Due   HIV Screening  Never done   Hepatitis C Screening  Never done   COLONOSCOPY (Pts 45-66yrs Insurance coverage will need to be confirmed)  Never done   Zoster Vaccines- Shingrix (1 of 2) Never done   COVID-19 Vaccine (2 - Pfizer series) 08/21/2020    There are no preventive care reminders to display for this patient.  No results found for: TSH Lab Results  Component Value Date   WBC 6.2 11/10/2014   HGB 13.3 11/10/2014   HCT 41.2 11/10/2014   MCV 92 11/10/2014   PLT 203 11/10/2014   Lab Results  Component Value Date   NA 139 11/10/2014   K 3.8 11/10/2014   CO2 28 11/10/2014   GLUCOSE 88 11/10/2014   BUN 12 11/10/2014   CREATININE 1.46 (H) 11/10/2014   BILITOT 0.3 03/07/2014   ALKPHOS 93 03/07/2014   AST 42 (H) 03/07/2014   ALT 29 03/07/2014   PROT 7.7 03/07/2014   ALBUMIN 4.0 03/07/2014   CALCIUM 8.3 (L) 11/10/2014   ANIONGAP 5 (L) 11/10/2014   No results found for: CHOL No results found for: HDL No results found for: LDLCALC No results found for: TRIG No results found for: CHOLHDL No results found for: ATFT7D    Assessment & Plan:    Problem List Items Addressed This Visit       Other   Left arm pain - Primary    Started him on ibuprofen 800 mg every 8 hours as needed. Apply diclofenac gel twice a day. Gave him the note for work.  Advised patient to call the office is symptoms fails to improve.          Meds ordered this encounter  Medications   ibuprofen (ADVIL) 800 MG tablet    Sig: Take 1 tablet (800 mg total) by mouth every 8 (eight) hours as needed.    Dispense:  30 tablet    Refill:  0   diclofenac Sodium (VOLTAREN) 1 % GEL    Sig: Apply 2 g topically 2 (two) times daily.    Dispense:  50 g    Refill:  1     Follow-up: Return if symptoms worsen or fail to improve.    Kara Dies, NP

## 2022-05-18 ENCOUNTER — Encounter: Payer: Self-pay | Admitting: Internal Medicine

## 2022-05-18 ENCOUNTER — Ambulatory Visit (INDEPENDENT_AMBULATORY_CARE_PROVIDER_SITE_OTHER): Payer: Medicare Other | Admitting: Internal Medicine

## 2022-05-18 VITALS — BP 135/87 | HR 78 | Ht 72.0 in | Wt 195.0 lb

## 2022-05-18 DIAGNOSIS — I10 Essential (primary) hypertension: Secondary | ICD-10-CM

## 2022-05-18 DIAGNOSIS — Z716 Tobacco abuse counseling: Secondary | ICD-10-CM | POA: Diagnosis not present

## 2022-05-18 DIAGNOSIS — E785 Hyperlipidemia, unspecified: Secondary | ICD-10-CM

## 2022-05-18 DIAGNOSIS — M7711 Lateral epicondylitis, right elbow: Secondary | ICD-10-CM

## 2022-05-18 NOTE — Assessment & Plan Note (Signed)
-   I instructed the patient to stop smoking and provided them with smoking cessation materials.  - I informed the patient that smoking puts them at increased risk for cancer, COPD, hypertension, and more.  - Informed the patient to seek help if they begin to have trouble breathing, develop chest pain, start to cough up blood, feel faint, or pass out.  

## 2022-05-18 NOTE — Assessment & Plan Note (Signed)
Kenalog was injected in the left elbow under local anesthesia 20 mg, patient tolerated the procedure well

## 2022-05-18 NOTE — Assessment & Plan Note (Signed)

## 2022-05-18 NOTE — Assessment & Plan Note (Signed)
Hypercholesterolemia  I advised the patient to follow Mediterranean diet This diet is rich in fruits vegetables and whole grain, and This diet is also rich in fish and lean meat Patient should also eat a handful of almonds or walnuts daily Recent heart study indicated that average follow-up on this kind of diet reduces the cardiovascular mortality by 50 to 70%== 

## 2022-05-18 NOTE — Progress Notes (Signed)
Established Patient Office Visit  Subjective:  Patient ID: Trevor Harmon, male    DOB: December 03, 1965  Age: 56 y.o. MRN: [id number]  CC:  Chief Complaint  Patient presents with   Arm Pain    Patient reports left arm pain, started last night. Patient states it is painful when he sleeps and applies pressure to that side.    Arm Pain     Trevor Harmon presents for left epicondyles pain, patient goes to sleep at night, his job is mostly lifting.  There is no history of chest pain shortness of breath sweating dizziness or passing out spell  Past Medical History:  Diagnosis Date   GERD (gastroesophageal reflux disease)    Hypertension     Past Surgical History:  Procedure Laterality Date   kidney stent     OTHER SURGICAL HISTORY     stent kidney    History reviewed. No pertinent family history.  Social History   Socioeconomic History   Marital status: Single    Spouse name: Not on file   Number of children: Not on file   Years of education: Not on file   Highest education level: Not on file  Occupational History   Not on file  Tobacco Use   Smoking status: Former    Packs/day: 1.00    Types: Cigarettes   Smokeless tobacco: Never  Vaping Use   Vaping Use: Never used  Substance and Sexual Activity   Alcohol use: No   Drug use: No   Sexual activity: Not Currently  Other Topics Concern   Not on file  Social History Narrative   Not on file   Social Determinants of Health   Financial Resource Strain: Low Risk  (06/06/2021)   Overall Financial Resource Strain (CARDIA)    Difficulty of Paying Living Expenses: Not very hard  Food Insecurity: No Food Insecurity (06/06/2021)   Hunger Vital Sign    Worried About Running Out of Food in the Last Year: Never true    Ran Out of Food in the Last Year: Never true  Transportation Needs: No Transportation Needs (06/06/2021)   PRAPARE - Administrator, Civil Service (Medical): No    Lack of Transportation (Non-Medical):  No  Physical Activity: Sufficiently Active (06/06/2021)   Exercise Vital Sign    Days of Exercise per Week: 7 days    Minutes of Exercise per Session: 40 min  Stress: No Stress Concern Present (06/06/2021)   [employer] of Occupational Health - Occupational Stress Questionnaire    Feeling of Stress : Not at all  Social Connections: Moderately Isolated (06/06/2021)   Social Connection and Isolation Panel [NHANES]    Frequency of Communication with Friends and Family: Once a week    Frequency of Social Gatherings with Friends and Family: Once a week    Attends Religious Services: More than 4 times per year    Active Member of [employer] or Organizations: Yes    Attends Banker Meetings: Never    Marital Status: Never married  Intimate Partner Violence: Not At Risk (06/06/2021)   Humiliation, Afraid, Rape, and Kick questionnaire    Fear of Current or Ex-Partner: No    Emotionally Abused: No    Physically Abused: No    Sexually Abused: No     Current Outpatient Medications:    ARIPiprazole (ABILIFY) 10 MG tablet, Take 10 mg by mouth daily., Disp: , Rfl:    cyclobenzaprine (FLEXERIL) 5 MG tablet,  Take 1 tablet (5 mg total) by mouth 3 (three) times daily as needed for muscle spasms., Disp: 30 tablet, Rfl: 1   diclofenac Sodium (VOLTAREN) 1 % GEL, Apply 2 g topically 2 (two) times daily., Disp: 50 g, Rfl: 1   Fluticasone-Salmeterol (ADVAIR) 250-50 MCG/DOSE AEPB, Inhale 1 puff into the lungs 2 (two) times daily., Disp: , Rfl:    ibuprofen (ADVIL) 800 MG tablet, Take 1 tablet (800 mg total) by mouth every 8 (eight) hours as needed., Disp: 30 tablet, Rfl: 0   loratadine (CLARITIN) 10 MG tablet, Take 1 tablet (10 mg total) by mouth daily., Disp: 30 tablet, Rfl: 11   losartan-hydrochlorothiazide (HYZAAR) 50-12.5 MG tablet, Take 1 tablet by mouth daily., Disp: 90 tablet, Rfl: 2   pravastatin (PRAVACHOL) 40 MG tablet, TAKE 1 TABLET BY MOUTH DAILY, Disp: 90 tablet, Rfl: 3   propranolol  (INDERAL) 20 MG tablet, TAKE 1 TABLET BY MOUTH TWICE A DAY, Disp: 60 tablet, Rfl: 3   ranitidine (ZANTAC) 150 MG tablet, Take 150 mg by mouth 2 (two) times daily., Disp: , Rfl:    traZODone (DESYREL) 100 MG tablet, TAKE 1 TABLET BY MOUTH AT BEDTIME, Disp: 30 tablet, Rfl: 3   triamterene-hydrochlorothiazide (MAXZIDE-25) 37.5-25 MG tablet, TAKE 1 TABLET BY MOUTH DAILY, Disp: 30 tablet, Rfl: 4   No Known Allergies  ROS Review of Systems  Constitutional: Negative.   HENT: Negative.    Eyes: Negative.   Respiratory: Negative.    Cardiovascular: Negative.   Gastrointestinal: Negative.   Endocrine: Negative.   Genitourinary: Negative.   Musculoskeletal:  Positive for arthralgias.       Left elbow pain  Skin: Negative.   Allergic/Immunologic: Negative.   Neurological: Negative.   Hematological: Negative.   Psychiatric/Behavioral: Negative.    All other systems reviewed and are negative.     Objective:    Physical Exam Vitals reviewed.  Constitutional:      Appearance: Normal appearance.  HENT:     Mouth/Throat:     Mouth: Mucous membranes are moist.  Eyes:     Pupils: Pupils are equal, round, and reactive to light.  Neck:     Vascular: No carotid bruit.  Cardiovascular:     Rate and Rhythm: Normal rate and regular rhythm.     Pulses: Normal pulses.     Heart sounds: Normal heart sounds.  Pulmonary:     Effort: Pulmonary effort is normal.     Breath sounds: Normal breath sounds.  Abdominal:     General: Bowel sounds are normal.     Palpations: Abdomen is soft. There is no hepatomegaly, splenomegaly or mass.     Tenderness: There is no abdominal tenderness.     Hernia: No hernia is present.  Musculoskeletal:       Arms:     Cervical back: Neck supple.     Right lower leg: No edema.     Left lower leg: No edema.     Comments: Painful left elbow  Skin:    Findings: No rash.  Neurological:     Mental Status: He is alert and oriented to person, place, and time.      Motor: No weakness.  Psychiatric:        Mood and Affect: Mood normal.        Behavior: Behavior normal.     BP 135/87   Pulse 78   Ht 6' (1.829 m)   Wt 195 lb (88.5 kg)   BMI 26.45 kg/m  Wt Readings from Last 3 Encounters:  05/18/22 195 lb (88.5 kg)  04/09/22 199 lb (90.3 kg)  03/09/22 190 lb 8 oz (86.4 kg)     Health Maintenance Due  Topic Date Due   HIV Screening  Never done   Hepatitis C Screening  Never done   COLONOSCOPY (Pts 45-66yrs Insurance coverage will need to be confirmed)  Never done   Zoster Vaccines- Shingrix (1 of 2) Never done   COVID-19 Vaccine (2 - Pfizer series) 09/25/2020    There are no preventive care reminders to display for this patient.  No results found for: "TSH" Lab Results  Component Value Date   WBC 6.2 11/10/2014   HGB 13.3 11/10/2014   HCT 41.2 11/10/2014   MCV 92 11/10/2014   PLT 203 11/10/2014   Lab Results  Component Value Date   NA 139 11/10/2014   K 3.8 11/10/2014   CO2 28 11/10/2014   GLUCOSE 88 11/10/2014   BUN 12 11/10/2014   CREATININE 1.46 (H) 11/10/2014   BILITOT 0.3 03/07/2014   ALKPHOS 93 03/07/2014   AST 42 (H) 03/07/2014   ALT 29 03/07/2014   PROT 7.7 03/07/2014   ALBUMIN 4.0 03/07/2014   CALCIUM 8.3 (L) 11/10/2014   ANIONGAP 5 (L) 11/10/2014   No results found for: "CHOL" No results found for: "HDL" No results found for: "LDLCALC" No results found for: "TRIG" No results found for: "CHOLHDL" No results found for: "HGBA1C"    Assessment & Plan:   Problem List Items Addressed This Visit       Cardiovascular and Mediastinum   Essential hypertension - Primary    The following hypertensive lifestyle modification were recommended and discussed:  1. Limiting alcohol intake to less than 1 oz/day of ethanol:(24 oz of beer or 8 oz of wine or 2 oz of 100-proof whiskey). 2. Take baby ASA 81 mg daily. 3. Importance of regular aerobic exercise and losing weight. 4. Reduce dietary saturated fat and  cholesterol intake for overall cardiovascular health. 5. Maintaining adequate dietary potassium, calcium, and magnesium intake. 6. Regular monitoring of the blood pressure. 7. Reduce sodium intake to less than 100 mmol/day (less than 2.3 gm of sodium or less than 6 gm of sodium choride)         Musculoskeletal and Integument   Lateral epicondylitis of right elbow    Kenalog was injected in the left elbow under local anesthesia 20 mg, patient tolerated the procedure well        Other   Dyslipidemia (high LDL; low HDL)    Hypercholesterolemia  I advised the patient to follow Mediterranean diet This diet is rich in fruits vegetables and whole grain, and This diet is also rich in fish and lean meat Patient should also eat a handful of almonds or walnuts daily Recent heart study indicated that average follow-up on this kind of diet reduces the cardiovascular mortality by 50 to 70%==      Tobacco abuse counseling    - I instructed the patient to stop smoking and provided them with smoking cessation materials.  - I informed the patient that smoking puts them at increased risk for cancer, COPD, hypertension, and more.  - Informed the patient to seek help if they begin to have trouble breathing, develop chest pain, start to cough up blood, feel faint, or pass out.      Joint Injection/Arthrocentesis  Date/Time: 05/18/2022 12:03 PM  Performed by: Cletis Athens, MD Authorized by: Cletis Athens, MD  Indications: pain  Body area: elbow Joint: left elbow  Sedation: Patient sedated: yes Vitals: Vital signs were monitored during sedation.  Needle size: 22 G Ultrasound guidance: no Approach: superior Lidocaine 1% amount: 1 mL Hydrocortisone amount: 20 mg Patient tolerance: patient tolerated the procedure well with no immediate complications Comments: Left epicondyles was injected after complete sterilization under local anesthesia.  Patient tolerated the procedure well and he was off  to sit in the waiting room for 5 minutes.  Sporting band was applied after the procedure was done.     No orders of the defined types were placed in this encounter.   Follow-up: No follow-ups on file.    Corky Downs, MD

## 2022-06-17 DIAGNOSIS — R52 Pain, unspecified: Secondary | ICD-10-CM | POA: Diagnosis not present

## 2022-06-17 DIAGNOSIS — R001 Bradycardia, unspecified: Secondary | ICD-10-CM | POA: Diagnosis not present

## 2022-06-17 DIAGNOSIS — S8992XA Unspecified injury of left lower leg, initial encounter: Secondary | ICD-10-CM | POA: Diagnosis not present

## 2022-06-17 DIAGNOSIS — M25561 Pain in right knee: Secondary | ICD-10-CM | POA: Diagnosis not present

## 2022-06-17 DIAGNOSIS — M25562 Pain in left knee: Secondary | ICD-10-CM | POA: Diagnosis not present

## 2022-06-17 DIAGNOSIS — I129 Hypertensive chronic kidney disease with stage 1 through stage 4 chronic kidney disease, or unspecified chronic kidney disease: Secondary | ICD-10-CM | POA: Diagnosis not present

## 2022-06-17 DIAGNOSIS — G9389 Other specified disorders of brain: Secondary | ICD-10-CM | POA: Diagnosis not present

## 2022-06-17 DIAGNOSIS — R079 Chest pain, unspecified: Secondary | ICD-10-CM | POA: Diagnosis not present

## 2022-06-17 DIAGNOSIS — M25461 Effusion, right knee: Secondary | ICD-10-CM | POA: Diagnosis not present

## 2022-06-17 DIAGNOSIS — R519 Headache, unspecified: Secondary | ICD-10-CM | POA: Diagnosis not present

## 2022-06-17 DIAGNOSIS — S032XXA Dislocation of tooth, initial encounter: Secondary | ICD-10-CM | POA: Diagnosis not present

## 2022-06-17 DIAGNOSIS — R072 Precordial pain: Secondary | ICD-10-CM | POA: Diagnosis not present

## 2022-06-17 DIAGNOSIS — S0990XA Unspecified injury of head, initial encounter: Secondary | ICD-10-CM | POA: Diagnosis not present

## 2022-06-17 DIAGNOSIS — I10 Essential (primary) hypertension: Secondary | ICD-10-CM | POA: Diagnosis not present

## 2022-06-17 DIAGNOSIS — R609 Edema, unspecified: Secondary | ICD-10-CM | POA: Diagnosis not present

## 2022-06-17 DIAGNOSIS — R9389 Abnormal findings on diagnostic imaging of other specified body structures: Secondary | ICD-10-CM | POA: Diagnosis not present

## 2022-06-17 DIAGNOSIS — S80919A Unspecified superficial injury of unspecified knee, initial encounter: Secondary | ICD-10-CM | POA: Diagnosis not present

## 2022-06-17 DIAGNOSIS — S0993XA Unspecified injury of face, initial encounter: Secondary | ICD-10-CM | POA: Diagnosis not present

## 2022-06-17 DIAGNOSIS — J449 Chronic obstructive pulmonary disease, unspecified: Secondary | ICD-10-CM | POA: Diagnosis not present

## 2022-06-17 DIAGNOSIS — M79662 Pain in left lower leg: Secondary | ICD-10-CM | POA: Diagnosis not present

## 2022-06-17 DIAGNOSIS — S80211A Abrasion, right knee, initial encounter: Secondary | ICD-10-CM | POA: Diagnosis not present

## 2022-06-17 DIAGNOSIS — S199XXA Unspecified injury of neck, initial encounter: Secondary | ICD-10-CM | POA: Diagnosis not present

## 2022-06-17 DIAGNOSIS — N189 Chronic kidney disease, unspecified: Secondary | ICD-10-CM | POA: Diagnosis not present

## 2022-06-17 DIAGNOSIS — R936 Abnormal findings on diagnostic imaging of limbs: Secondary | ICD-10-CM | POA: Diagnosis not present

## 2022-06-17 DIAGNOSIS — F17210 Nicotine dependence, cigarettes, uncomplicated: Secondary | ICD-10-CM | POA: Diagnosis not present

## 2022-06-17 DIAGNOSIS — S80212A Abrasion, left knee, initial encounter: Secondary | ICD-10-CM | POA: Diagnosis not present

## 2022-06-17 DIAGNOSIS — S0081XA Abrasion of other part of head, initial encounter: Secondary | ICD-10-CM | POA: Diagnosis not present

## 2022-06-17 DIAGNOSIS — Z7951 Long term (current) use of inhaled steroids: Secondary | ICD-10-CM | POA: Diagnosis not present

## 2022-06-17 DIAGNOSIS — Z041 Encounter for examination and observation following transport accident: Secondary | ICD-10-CM | POA: Diagnosis not present

## 2022-06-17 DIAGNOSIS — Z79899 Other long term (current) drug therapy: Secondary | ICD-10-CM | POA: Diagnosis not present

## 2022-06-19 DIAGNOSIS — R001 Bradycardia, unspecified: Secondary | ICD-10-CM | POA: Diagnosis not present

## 2022-06-30 ENCOUNTER — Ambulatory Visit (INDEPENDENT_AMBULATORY_CARE_PROVIDER_SITE_OTHER): Payer: Medicare Other | Admitting: Internal Medicine

## 2022-06-30 ENCOUNTER — Encounter: Payer: Self-pay | Admitting: Internal Medicine

## 2022-06-30 VITALS — BP 122/84 | HR 86 | Ht 72.0 in | Wt 197.5 lb

## 2022-06-30 DIAGNOSIS — M25561 Pain in right knee: Secondary | ICD-10-CM | POA: Diagnosis not present

## 2022-06-30 DIAGNOSIS — I10 Essential (primary) hypertension: Secondary | ICD-10-CM | POA: Diagnosis not present

## 2022-06-30 DIAGNOSIS — M25562 Pain in left knee: Secondary | ICD-10-CM | POA: Diagnosis not present

## 2022-06-30 DIAGNOSIS — Z716 Tobacco abuse counseling: Secondary | ICD-10-CM | POA: Diagnosis not present

## 2022-06-30 DIAGNOSIS — F5101 Primary insomnia: Secondary | ICD-10-CM
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
## 2022-06-30 NOTE — Assessment & Plan Note (Signed)
Stable at the present time. 
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
## 2022-06-30 NOTE — Assessment & Plan Note (Signed)
Patient was advised to quit smoking 

## 2022-06-30 NOTE — Assessment & Plan Note (Signed)

## 2022-07-14 ENCOUNTER — Other Ambulatory Visit: Payer: Self-pay | Admitting: Internal Medicine

## 2022-09-21 ENCOUNTER — Other Ambulatory Visit: Payer: Self-pay | Admitting: Internal Medicine

## 2022-11-05 ENCOUNTER — Emergency Department: Payer: Medicare Other

## 2022-11-05 DIAGNOSIS — Z87891 Personal history of nicotine dependence: Secondary | ICD-10-CM | POA: Insufficient documentation

## 2022-11-05 DIAGNOSIS — R591 Generalized enlarged lymph nodes: Secondary | ICD-10-CM | POA: Diagnosis not present

## 2022-11-05 DIAGNOSIS — N179 Acute kidney failure, unspecified: Secondary | ICD-10-CM | POA: Diagnosis not present

## 2022-11-05 DIAGNOSIS — J101 Influenza due to other identified influenza virus with other respiratory manifestations: Secondary | ICD-10-CM | POA: Diagnosis not present

## 2022-11-05 DIAGNOSIS — Z1152 Encounter for screening for COVID-19: Secondary | ICD-10-CM | POA: Insufficient documentation

## 2022-11-05 DIAGNOSIS — J45909 Unspecified asthma, uncomplicated: Secondary | ICD-10-CM | POA: Insufficient documentation

## 2022-11-05 DIAGNOSIS — Z79899 Other long term (current) drug therapy: Secondary | ICD-10-CM | POA: Insufficient documentation

## 2022-11-05 DIAGNOSIS — J449 Chronic obstructive pulmonary disease, unspecified: Secondary | ICD-10-CM | POA: Diagnosis not present

## 2022-11-05 DIAGNOSIS — R918 Other nonspecific abnormal finding of lung field: Secondary | ICD-10-CM | POA: Diagnosis not present

## 2022-11-05 DIAGNOSIS — I129 Hypertensive chronic kidney disease with stage 1 through stage 4 chronic kidney disease, or unspecified chronic kidney disease: Secondary | ICD-10-CM | POA: Insufficient documentation

## 2022-11-05 DIAGNOSIS — R0902 Hypoxemia: Secondary | ICD-10-CM | POA: Diagnosis not present

## 2022-11-05 DIAGNOSIS — R0602 Shortness of breath: Secondary | ICD-10-CM | POA: Diagnosis not present

## 2022-11-05 DIAGNOSIS — R059 Cough, unspecified: Secondary | ICD-10-CM | POA: Diagnosis not present

## 2022-11-05 DIAGNOSIS — N1831 Chronic kidney disease, stage 3a: Secondary | ICD-10-CM | POA: Diagnosis not present

## 2022-11-05 DIAGNOSIS — Z743 Need for continuous supervision: Secondary | ICD-10-CM | POA: Diagnosis not present

## 2022-11-05 DIAGNOSIS — R079 Chest pain, unspecified: Secondary | ICD-10-CM | POA: Diagnosis not present

## 2022-11-05 LAB — RESP PANEL BY RT-PCR (RSV, FLU A&B, COVID)  RVPGX2
Influenza A by PCR: POSITIVE — AB
Influenza B by PCR: NEGATIVE
Resp Syncytial Virus by PCR: NEGATIVE
SARS Coronavirus 2 by RT PCR: NEGATIVE

## 2022-11-05 LAB — COMPREHENSIVE METABOLIC PANEL
ALT: 14 U/L (ref 0–44)
AST: 29 U/L (ref 15–41)
Albumin: 3.9 g/dL (ref 3.5–5.0)
Alkaline Phosphatase: 79 U/L (ref 38–126)
Anion gap: 8 (ref 5–15)
BUN: 20 mg/dL (ref 6–20)
CO2: 28 mmol/L (ref 22–32)
Calcium: 8.9 mg/dL (ref 8.9–10.3)
Chloride: 98 mmol/L (ref 98–111)
Creatinine, Ser: 2.09 mg/dL — ABNORMAL HIGH (ref 0.61–1.24)
GFR, Estimated: 37 mL/min — ABNORMAL LOW (ref >60)
Glucose, Bld: 107 mg/dL — ABNORMAL HIGH (ref 70–99)
Potassium: 3.8 mmol/L (ref 3.5–5.1)
Sodium: 134 mmol/L — ABNORMAL LOW (ref 135–145)
Total Bilirubin: 0.8 mg/dL (ref 0.3–1.2)
Total Protein: 7.6 g/dL (ref 6.5–8.1)

## 2022-11-05 LAB — CBC
HCT: 41.7 % (ref 39.0–52.0)
Hemoglobin: 13.2 g/dL (ref 13.0–17.0)
MCH: 28.7 pg (ref 26.0–34.0)
MCHC: 31.7 g/dL (ref 30.0–36.0)
MCV: 90.7 fL (ref 80.0–100.0)
Platelets: 253 10*3/uL (ref 150–400)
RBC: 4.6 MIL/uL (ref 4.22–5.81)
RDW: 11.9 % (ref 11.5–15.5)
WBC: 7.3 10*3/uL (ref 4.0–10.5)
nRBC: 0 % (ref 0.0–0.2)

## 2022-11-05 LAB — D-DIMER, QUANTITATIVE: D-Dimer, Quant: 1.87 ug/mL-FEU — ABNORMAL HIGH (ref 0.00–0.50)

## 2022-11-05 MED ORDER — ACETAMINOPHEN 325 MG PO TABS
650.0000 mg | ORAL_TABLET | Freq: Once | ORAL | Status: AC
  Administered 2022-11-05: 650 mg via ORAL
  Filled 2022-11-05: qty 2

## 2022-11-05 MED ORDER — IOHEXOL 350 MG/ML SOLN
75.0000 mL | Freq: Once | INTRAVENOUS | Status: AC | PRN
  Administered 2022-11-05: 75 mL via INTRAVENOUS

## 2022-11-05 NOTE — ED Notes (Signed)
Reviewed chart & results with Dr Lenard Lance, no further orders given at this time

## 2022-11-05 NOTE — ED Notes (Signed)
CT aware pt has IV

## 2022-11-05 NOTE — ED Triage Notes (Signed)
First Nurse Note:  While at work, unloading trucks felt SOB.  Arrives via [facility].  VS wnl.  96% RA.  Denies complaint.

## 2022-11-05 NOTE — ED Triage Notes (Signed)
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 EMS reports VSS, GCS 15. NAD noted at current.

## 2022-11-06 ENCOUNTER — Observation Stay
Admission: EM | Admit: 2022-11-06 | Discharge: 2022-11-06 | Disposition: A | Discharge status: Home / Self Care | Payer: Medicare Other | Attending: Student | Admitting: Student

## 2022-11-06 ENCOUNTER — Inpatient Hospital Stay: Payer: Medicare Other

## 2022-11-06 ENCOUNTER — Other Ambulatory Visit: Payer: Self-pay

## 2022-11-06 DIAGNOSIS — J449 Chronic obstructive pulmonary disease, unspecified: Secondary | ICD-10-CM

## 2022-11-06 DIAGNOSIS — R942 Abnormal results of pulmonary function studies: Secondary | ICD-10-CM

## 2022-11-06 DIAGNOSIS — N179 Acute kidney failure, unspecified: Secondary | ICD-10-CM

## 2022-11-06 DIAGNOSIS — Z0389 Encounter for observation for other suspected diseases and conditions ruled out: Secondary | ICD-10-CM | POA: Diagnosis not present

## 2022-11-06 DIAGNOSIS — R0602 Shortness of breath: Secondary | ICD-10-CM | POA: Diagnosis not present

## 2022-11-06 DIAGNOSIS — I10 Essential (primary) hypertension: Secondary | ICD-10-CM | POA: Diagnosis not present

## 2022-11-06 DIAGNOSIS — F39 Unspecified mood [affective] disorder: Secondary | ICD-10-CM

## 2022-11-06 DIAGNOSIS — N189 Chronic kidney disease, unspecified: Secondary | ICD-10-CM

## 2022-11-06 DIAGNOSIS — J101 Influenza due to other identified influenza virus with other respiratory manifestations: Secondary | ICD-10-CM | POA: Diagnosis not present

## 2022-11-06 LAB — URINALYSIS, ROUTINE W REFLEX MICROSCOPIC
Bacteria, UA: NONE SEEN
Bilirubin Urine: NEGATIVE
Glucose, UA: NEGATIVE mg/dL
Ketones, ur: NEGATIVE mg/dL
Nitrite: NEGATIVE
Protein, ur: 100 mg/dL — AB
Specific Gravity, Urine: 1.029 (ref 1.005–1.030)
pH: 5 (ref 5.0–8.0)

## 2022-11-06 LAB — RENAL FUNCTION PANEL
Albumin: 3.4 g/dL — ABNORMAL LOW (ref 3.5–5.0)
Anion gap: 6 (ref 5–15)
BUN: 19 mg/dL (ref 6–20)
CO2: 25 mmol/L (ref 22–32)
Calcium: 8.2 mg/dL — ABNORMAL LOW (ref 8.9–10.3)
Chloride: 102 mmol/L (ref 98–111)
Creatinine, Ser: 1.5 mg/dL — ABNORMAL HIGH (ref 0.61–1.24)
GFR, Estimated: 55 mL/min — ABNORMAL LOW (ref >60)
Glucose, Bld: 110 mg/dL — ABNORMAL HIGH (ref 70–99)
Phosphorus: 2.5 mg/dL (ref 2.5–4.6)
Potassium: 3.3 mmol/L — ABNORMAL LOW (ref 3.5–5.1)
Sodium: 133 mmol/L — ABNORMAL LOW (ref 135–145)

## 2022-11-06 LAB — HIV ANTIBODY (ROUTINE TESTING W REFLEX): HIV Screen 4th Generation wRfx: NONREACTIVE

## 2022-11-06 LAB — CBC
HCT: 36.3 % — ABNORMAL LOW (ref 39.0–52.0)
Hemoglobin: 11.7 g/dL — ABNORMAL LOW (ref 13.0–17.0)
MCH: 28.6 pg (ref 26.0–34.0)
MCHC: 32.2 g/dL (ref 30.0–36.0)
MCV: 88.8 fL (ref 80.0–100.0)
Platelets: 235 10*3/uL (ref 150–400)
RBC: 4.09 MIL/uL — ABNORMAL LOW (ref 4.22–5.81)
RDW: 11.9 % (ref 11.5–15.5)
WBC: 4.5 10*3/uL (ref 4.0–10.5)
nRBC: 0 % (ref 0.0–0.2)

## 2022-11-06 LAB — TROPONIN I (HIGH SENSITIVITY): Troponin I (High Sensitivity): 9 ng/L (ref <18)

## 2022-11-06 LAB — PROCALCITONIN: Procalcitonin: 0.15 ng/mL

## 2022-11-06 LAB — CREATININE, SERUM
Creatinine, Ser: 1.56 mg/dL — ABNORMAL HIGH (ref 0.61–1.24)
GFR, Estimated: 52 mL/min — ABNORMAL LOW (ref >60)

## 2022-11-06 MED ORDER — TRAZODONE HCL 50 MG PO TABS: 25.0000 mg | ORAL_TABLET | Freq: Every evening | ORAL | Status: DC | PRN

## 2022-11-06 MED ORDER — SENNOSIDES-DOCUSATE SODIUM 8.6-50 MG PO TABS: 1.0000 | ORAL_TABLET | Freq: Two times a day (BID) | ORAL | Status: DC | PRN

## 2022-11-06 MED ORDER — ACETAMINOPHEN 325 MG PO TABS: 650.0000 mg | ORAL_TABLET | Freq: Four times a day (QID) | ORAL | Status: DC | PRN

## 2022-11-06 MED ORDER — ACETAMINOPHEN 650 MG RE SUPP: 650.0000 mg | Freq: Four times a day (QID) | RECTAL | Status: DC | PRN

## 2022-11-06 MED ORDER — MAGNESIUM HYDROXIDE 400 MG/5ML PO SUSP: 30.0000 mL | Freq: Every day | ORAL | Status: DC | PRN

## 2022-11-06 MED ORDER — TECHNETIUM TO 99M ALBUMIN AGGREGATED
3.9100 | Freq: Once | INTRAVENOUS | Status: AC | PRN
  Administered 2022-11-06: 3.91 via INTRAVENOUS

## 2022-11-06 MED ORDER — ENOXAPARIN SODIUM 40 MG/0.4ML IJ SOSY
40.0000 mg | PREFILLED_SYRINGE | INTRAMUSCULAR | Status: DC
  Administered 2022-11-06: 40 mg via SUBCUTANEOUS
  Filled 2022-11-06: qty 0.4

## 2022-11-06 MED ORDER — OSELTAMIVIR PHOSPHATE 30 MG PO CAPS
30.0000 mg | ORAL_CAPSULE | Freq: Two times a day (BID) | ORAL | Status: DC
  Administered 2022-11-06: 30 mg via ORAL
  Filled 2022-11-06: qty 1

## 2022-11-06 MED ORDER — TRAZODONE HCL 100 MG PO TABS: 100.0000 mg | ORAL_TABLET | Freq: Every day | ORAL | Status: DC

## 2022-11-06 MED ORDER — IPRATROPIUM-ALBUTEROL 0.5-2.5 (3) MG/3ML IN SOLN: 3.0000 mL | RESPIRATORY_TRACT | Status: DC | PRN

## 2022-11-06 MED ORDER — POTASSIUM CHLORIDE CRYS ER 20 MEQ PO TBCR
60.0000 meq | EXTENDED_RELEASE_TABLET | ORAL | Status: DC
  Administered 2022-11-06: 60 meq via ORAL
  Filled 2022-11-06: qty 3

## 2022-11-06 MED ORDER — ALBUTEROL SULFATE HFA 108 (90 BASE) MCG/ACT IN AERS: 2.0000 | INHALATION_SPRAY | Freq: Four times a day (QID) | RESPIRATORY_TRACT | 2 refills | Status: AC | PRN

## 2022-11-06 MED ORDER — ENOXAPARIN SODIUM 40 MG/0.4ML IJ SOSY: 40.0000 mg | PREFILLED_SYRINGE | INTRAMUSCULAR | Status: DC

## 2022-11-06 MED ORDER — ONDANSETRON 4 MG PO TBDP: 4.0000 mg | ORAL_TABLET | Freq: Four times a day (QID) | ORAL | 0 refills | Status: AC | PRN

## 2022-11-06 MED ORDER — PROPRANOLOL HCL 20 MG PO TABS
20.0000 mg | ORAL_TABLET | Freq: Two times a day (BID) | ORAL | Status: DC
  Administered 2022-11-06: 20 mg via ORAL
  Filled 2022-11-06: qty 1

## 2022-11-06 MED ORDER — FLUTICASONE-SALMETEROL 250-50 MCG/DOSE IN AEPB: 1.0000 | INHALATION_SPRAY | Freq: Two times a day (BID) | RESPIRATORY_TRACT | 1 refills | Status: AC

## 2022-11-06 MED ORDER — ONDANSETRON HCL 4 MG/2ML IJ SOLN: 4.0000 mg | Freq: Four times a day (QID) | INTRAMUSCULAR | Status: DC | PRN

## 2022-11-06 MED ORDER — PRAVASTATIN SODIUM 20 MG PO TABS
40.0000 mg | ORAL_TABLET | Freq: Every day | ORAL | Status: DC
  Administered 2022-11-06: 40 mg via ORAL
  Filled 2022-11-06: qty 2

## 2022-11-06 MED ORDER — TRIAMTERENE-HCTZ 37.5-25 MG PO TABS: 1.0000 | ORAL_TABLET | Freq: Every day | ORAL | 1 refills | Status: AC

## 2022-11-06 MED ORDER — SODIUM CHLORIDE 0.9 % IV SOLN: INTRAVENOUS | Status: DC

## 2022-11-06 MED ORDER — OSELTAMIVIR PHOSPHATE 75 MG PO CAPS: 75.0000 mg | ORAL_CAPSULE | Freq: Two times a day (BID) | ORAL | 0 refills | Status: DC

## 2022-11-06 MED ORDER — POLYETHYLENE GLYCOL 3350 17 G PO PACK: 17.0000 g | PACK | Freq: Two times a day (BID) | ORAL | Status: DC | PRN

## 2022-11-06 MED ORDER — SODIUM CHLORIDE 0.9 % IV BOLUS (SEPSIS)
1000.0000 mL | Freq: Once | INTRAVENOUS | Status: AC
  Administered 2022-11-06: 1000 mL via INTRAVENOUS

## 2022-11-06 MED ORDER — ONDANSETRON HCL 4 MG PO TABS: 4.0000 mg | ORAL_TABLET | Freq: Four times a day (QID) | ORAL | Status: DC | PRN

## 2022-11-06 MED ORDER — FLUTICASONE FUROATE-VILANTEROL 100-25 MCG/ACT IN AEPB
1.0000 | INHALATION_SPRAY | Freq: Every day | RESPIRATORY_TRACT | Status: DC
  Administered 2022-11-06: 1 via RESPIRATORY_TRACT
  Filled 2022-11-06: qty 28

## 2022-11-06 MED ORDER — OSELTAMIVIR PHOSPHATE 30 MG PO CAPS: 30.0000 mg | ORAL_CAPSULE | Freq: Two times a day (BID) | ORAL | 0 refills | Status: AC

## 2022-11-06 NOTE — Discharge Instructions (Addendum)
You may alternate Tylenol 1000 mg every 6 hours as needed for fever and pain and ibuprofen 800 mg every 6-8 hours as needed for fever and pain. Please rest and drink plenty of fluids. This is a viral illness causing your symptoms. You do not need antibiotics for a virus. You may use over-the-counter nasal saline spray and Afrin nasal saline spray as needed for nasal congestion. Please do not use Afrin for more than 3 days in a row. You may use guaifenesin and dextromethorphan as needed for cough.  You may use lozenges and Chloraseptic spray to help with sore throat.  Warm salt water gargles can also help with sore throat.  You may use over-the-counter Unisom (doxyalamine) or Benadryl (diphenhydramine) to help with sleep.  Please note that some combination medicines such as DayQuil and NyQuil have multiple medications in them.  Please make sure you look at all labels to ensure that you are not taking too much of any one particular medication.  Symptoms from a virus may take 7-14 days to run its course.  The flu is treated like any other virus with supportive measures as listed above.  We have reviewed medication called Tamiflu that can shorten the course of the flu by about a day.  These medications have to be taken within the first 48 hours of symptoms.  Tamiflu has many side effects including nausea, vomiting and diarrhea.

## 2022-11-06 NOTE — Progress Notes (Signed)
PT Cancellation Note  Patient Details Name: Trevor Harmon MRN: [id number] DOB: 1966-05-04   Cancelled Treatment:    Reason Eval/Treat Not Completed: Medical issues which prohibited therapy (Consult received and chart reviewed.  Patient pending LE dopplers to r/o DVT and with plans for VQ scan in AM to r/o PE.  Will hold mobility assessment until tests complete, results received and patient cleared for safe mobility.)  Hallie Ishida H. Manson Passey, PT, DPT, NCS 11/06/22, 2:12 PM [phone]

## 2022-11-06 NOTE — Care Management CC44 (Signed)
Condition Code 44 Documentation Completed  Patient Details  Name: TAMARI BUSIC MRN: [id number] Date of Birth: 1966-01-05   Condition Code 44 given:  Yes Patient signature on Condition Code 44 notice:  Yes Documentation of 2 MD's agreement:  Yes Code 44 added to claim:  Yes    Allayne Butcher, RN 11/06/2022, 4:40 PM

## 2022-11-06 NOTE — ED Notes (Signed)
Breakfast tray given. °

## 2022-11-06 NOTE — Care Management Obs Status (Signed)
MEDICARE OBSERVATION STATUS NOTIFICATION   Patient Details  Name: Trevor Harmon MRN: [id number] Date of Birth: [birth year]   Medicare Observation Status Notification Given:  Yes    Allayne Butcher, RN 11/06/2022, 4:40 PM

## 2022-11-06 NOTE — TOC Initial Note (Signed)
Transition of Care [facility]) - Initial/Assessment Note    Patient Details  Name: Trevor Harmon MRN: [id number] Date of Birth: 1966/02/26  Transition of Care [facility]) CM/SW Contact:    Allayne Butcher, RN Phone Number: 11/06/2022, 4:52 PM  Clinical Narrative:                  Transition of Care [facility]) Screening Note   Patient Details  Name: Trevor Harmon Date of Birth: 1965/11/28   [facility]) CM/SW Contact:    Allayne Butcher, RN Phone Number: 11/06/2022, 4:52 PM    Transition of Care Department [facility]) has reviewed patient and no TOC needs have been identified at this time. We will continue to monitor patient advancement through interdisciplinary progression rounds. If new patient transition needs arise, please place a TOC consult.    Patient reports that his father, Corry Ihnen, [phone] can come and pick him up this afternoon.         Patient Goals and CMS Choice            Expected Discharge Plan and Services         Expected Discharge Date: 11/06/22                                    Prior Living Arrangements/Services                       Activities of Daily Living      Permission Sought/Granted                  Emotional Assessment              Admission diagnosis:  Acute kidney injury superimposed on chronic kidney disease (HCC) [N17.9, N18.9] Patient Active Problem List   Diagnosis Date Noted   Acute kidney injury superimposed on chronic kidney disease (HCC) 11/06/2022   Mood disorder (HCC) 11/06/2022   Chronic obstructive airway disease (HCC) 11/06/2022   Influenza A 11/06/2022   Abnormal perfusion scan of lung 11/06/2022   Pain in both knees 06/30/2022   Left arm pain 04/09/2022   Acute bilateral low back pain without sciatica 03/09/2022   Diarrhea 12/23/2021   Lateral epicondylitis of right elbow 06/17/2021   Bee sting reaction 04/29/2021   Essential hypertension 07/08/2020   Arthralgia of left  temporomandibular joint 07/08/2020   Dyslipidemia (high LDL; low HDL) 07/08/2020   Tobacco abuse counseling 07/08/2020   Primary insomnia 07/08/2020   PCP:  Corky Downs, MD Pharmacy:   [facility] - [place], [place] - [address] [place] [address] Phone: [phone] Fax: [phone]     Social Determinants of Health (SDOH) Social History: SDOH Screenings   Food Insecurity: No Food Insecurity (06/06/2021)  Housing: Low Risk  (06/06/2021)  Transportation Needs: No Transportation Needs (06/06/2021)  Depression (PHQ2-9): Low Risk  (12/23/2021)  Financial Resource Strain: Low Risk  (06/06/2021)  Physical Activity: Sufficiently Active (06/06/2021)  Social Connections: Moderately Isolated (06/06/2021)  Stress: No Stress Concern Present (06/06/2021)  Tobacco Use: Medium Risk (11/05/2022)   SDOH Interventions:     Readmission Risk Interventions     No data to display

## 2022-11-06 NOTE — Discharge Summary (Signed)
Physician Discharge Summary   Patient: Trevor Harmon MRN: [id number] DOB: [birth year]  Admit date:     11/06/2022  Discharge date: 11/06/22  Discharge Physician: Almon Hercules   PCP: Corky Downs, MD   Recommendations at discharge:   Follow-up with PCP in 1 to 2 weeks Reassess blood pressure and renal function at follow-up   Discharge Diagnoses: Principal Problem:   Acute kidney injury superimposed on chronic kidney disease (HCC) Active Problems:   Essential hypertension   Mood disorder (HCC)   Chronic obstructive airway disease (HCC)   Influenza A H1N1 infection   Abnormal perfusion scan of lung  Resolved Problems:   * No resolved hospital problems. *  Hospital Course: 56 y.o. 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  In ED, he had mild fever to 100.7 and mild tachycardia to 102.  He was normotensive with normal saturation on room air.  CMP shows mild AKI with creatinine of 2.09 (baseline about 1.5).  He tested positive for influenza A.  D-dimer elevated to 1.87.  VQ scan was inconclusive.  However, CTA chest and lower extremity venous Doppler negative for PE and DVT respectively.  CTA chest did not show pneumonia but mild scattered ill-defined peribronchovascular nodularity suggesting infectious bronchiolitis.  Patient was started on IV fluid. He was started on Tamiflu for influenza.  At the time of discharge, respiratory symptoms resolved except for mild cough.  Remained stable on room air.  AKI resolved.  He is discharged on Tamiflu 30 mg twice daily for 5 days.  Advised to hold his Maxide until follow-up with PCP.  Refilled his Advair and prescribed albuterol as needed for respiratory symptoms.     Consultants: None Procedures performed: None Disposition:  [place] Diet  recommendation:  Discharge Diet Orders (From admission, onward)     Start     Ordered   11/06/22 0000  Diet - low sodium heart healthy        11/06/22 1629           Cardiac diet DISCHARGE MEDICATION: Allergies as of 11/06/2022   No Known Allergies      Medication List     STOP taking these medications    cyclobenzaprine 5 MG tablet Commonly known as: FLEXERIL   losartan-hydrochlorothiazide 50-12.5 MG tablet Commonly known as: HYZAAR       TAKE these medications    albuterol 108 (90 Base) MCG/ACT inhaler Commonly known as: VENTOLIN HFA Inhale 2 puffs into the lungs every 6 (six) hours as needed for wheezing or shortness of breath.   ARIPiprazole 10 MG tablet Commonly known as: ABILIFY Take 10 mg by mouth daily.   diclofenac Sodium 1 % Gel Commonly known as: VOLTAREN Apply 2 g topically 2 (two) times daily.   Fluticasone-Salmeterol 250-50 MCG/DOSE Aepb Commonly known as: ADVAIR Inhale 1 puff into the lungs 2 (two) times daily. What changed: when to take this   loratadine 10 MG tablet Commonly known as: CLARITIN Take 1 tablet (10 mg total) by mouth daily.   ondansetron 4 MG disintegrating tablet Commonly known as: ZOFRAN-ODT Take 1 tablet (4 mg total) by mouth every 6 (six) hours as needed for nausea or vomiting.   oseltamivir 30 MG capsule Commonly known as: TAMIFLU Take 1 capsule (30 mg total) by mouth 2 (two) times daily  for 5 days.   pravastatin 40 MG tablet Commonly known as: PRAVACHOL TAKE 1 TABLET BY MOUTH DAILY   propranolol 20 MG tablet Commonly known as: INDERAL TAKE 1 TABLET BY MOUTH TWICE A DAY   ranitidine 150 MG tablet Commonly known as: ZANTAC Take 150 mg by mouth 2 (two) times daily.   traZODone 100 MG tablet Commonly known as: DESYREL TAKE 1 TABLET BY MOUTH AT BEDTIME   triamterene-hydrochlorothiazide 37.5-25 MG tablet Commonly known as: MAXZIDE-25 Take 1 tablet by mouth daily. Start taking on: November 13, 2022 What  changed: These instructions start on November 13, 2022. If you are unsure what to do until then, ask your doctor or other care provider.        Follow-up Information     Corky Downs, MD .   Specialties: Internal Medicine, Cardiology Why: As needed Contact information: [address] [place] [address] [phone]                Discharge Exam: Ceasar Mons Weights   11/05/22 1915  Weight: 68 kg   GENERAL: No apparent distress.  Nontoxic. HEENT: MMM.  Vision and hearing grossly intact.  NECK: Supple.  No apparent JVD.  RESP:  No IWOB.  Fair aeration bilaterally. CVS:  RRR. Heart sounds normal.  ABD/GI/GU: BS+. Abd soft, NTND.  MSK/EXT:  Moves extremities. No apparent deformity. No edema.  SKIN: no apparent skin lesion or wound NEURO: Awake and alert. Oriented x 4 except year.  No apparent focal neuro deficit. PSYCH: Calm. Normal affect.   Condition at discharge: good  The results of significant diagnostics from this hospitalization (including imaging, microbiology, ancillary and laboratory) are listed below for reference.   Imaging Studies: US Venous Img Lower Bilateral (DVT)  Result Date: 11/06/2022 CLINICAL DATA:  Shortness of breath EXAM: BILATERAL LOWER EXTREMITY VENOUS DOPPLER ULTRASOUND TECHNIQUE: Gray-scale sonography with graded compression, as well as color Doppler and duplex ultrasound were performed to evaluate the lower extremity deep venous systems from the level of the common femoral vein and including the common femoral, femoral, profunda femoral, popliteal and calf veins including the posterior tibial, peroneal and gastrocnemius veins when visible. The superficial great saphenous vein was also interrogated. Spectral Doppler was utilized to evaluate flow at rest and with distal augmentation maneuvers in the common femoral, femoral and popliteal veins. COMPARISON:  None Available. FINDINGS: RIGHT LOWER EXTREMITY Common Femoral Vein: No evidence of thrombus.  Normal compressibility, respiratory phasicity and response to augmentation. Saphenofemoral Junction: No evidence of thrombus. Normal compressibility and flow on color Doppler imaging. Profunda Femoral Vein: No evidence of thrombus. Normal compressibility and flow on color Doppler imaging. Femoral Vein: No evidence of thrombus. Normal compressibility, respiratory phasicity and response to augmentation. Popliteal Vein: No evidence of thrombus. Normal compressibility, respiratory phasicity and response to augmentation. Calf Veins: No evidence of thrombus. Normal compressibility and flow on color Doppler imaging. Superficial Great Saphenous Vein: No evidence of thrombus. Normal compressibility. Venous Reflux:  None. Other Findings:  None. LEFT LOWER EXTREMITY Common Femoral Vein: No evidence of thrombus. Normal compressibility, respiratory phasicity and response to augmentation. Saphenofemoral Junction: No evidence of thrombus. Normal compressibility and flow on color Doppler imaging. Profunda Femoral Vein: No evidence of thrombus. Normal compressibility and flow on color Doppler imaging. Femoral Vein: No evidence of thrombus. Normal compressibility, respiratory phasicity and response to augmentation. Popliteal Vein: No evidence of thrombus. Normal compressibility, respiratory phasicity and response to augmentation. Calf Veins: No evidence of thrombus. Normal compressibility and flow on  color Doppler imaging. Superficial Great Saphenous Vein: No evidence of thrombus. Normal compressibility. Venous Reflux:  None. Other Findings:  None. IMPRESSION: No evidence of deep venous thrombosis in either lower extremity. Electronically Signed   By: Lorenza Cambridge M.D.   On: 11/06/2022 14:58   CT Angio Chest PE W and/or Wo Contrast  Result Date: 11/06/2022 CLINICAL DATA:  Shortness of breath for 2-3 days, positive flu, cough, headache. EXAM: CT ANGIOGRAPHY CHEST WITH CONTRAST TECHNIQUE: Multidetector CT imaging of the chest was  performed using the standard protocol during bolus administration of intravenous contrast. Multiplanar CT image reconstructions and MIPs were obtained to evaluate the vascular anatomy. RADIATION DOSE REDUCTION: This exam was performed according to the departmental dose-optimization program which includes automated exposure control, adjustment of the mA and/or kV according to patient size and/or use of iterative reconstruction technique. CONTRAST:  66mL OMNIPAQUE IOHEXOL 350 MG/ML SOLN COMPARISON:  05/22/2005. FINDINGS: Cardiovascular: Negative for pulmonary embolus. Heart is at the upper limits normal in size. Left ventricle appears hypertrophied. No pericardial effusion. Mediastinum/Nodes: Mediastinal lymph nodes measure up to 11 mm in the low right paratracheal station, similar to 05/22/2005. Low internal jugular lymph nodes are not enlarged by CT size criteria. No hilar or axillary adenopathy. Esophagus is grossly unremarkable. Lungs/Pleura: Minimal scattered ill-defined peribronchovascular nodularity. No suspicious pulmonary nodules. No airspace consolidation. Minimal scattered mucoid impaction. Trace bilateral pleural fluid. Debris is seen in the airway. Upper Abdomen: Visualized portions of the liver, gallbladder, adrenal glands, spleen, pancreas, stomach and bowel are grossly unremarkable. No upper abdominal adenopathy. Musculoskeletal: No worrisome lytic or sclerotic lesions. Review of the MIP images confirms the above findings. IMPRESSION: 1. Negative for pulmonary embolus. 2. Mild scattered ill-defined peribronchovascular nodularity, indicative of an infectious bronchiolitis. 3. Trace bilateral pleural fluid. Electronically Signed   By: Leanna Battles M.D.   On: 11/06/2022 12:57   NM Pulmonary Perfusion  Result Date: 11/06/2022 CLINICAL DATA:  Concern for pulmonary embolism. EXAM: NUCLEAR MEDICINE PERFUSION LUNG SCAN TECHNIQUE: Perfusion images were obtained in multiple projections after intravenous  injection of radiopharmaceutical. RADIOPHARMACEUTICALS:  3.9 mCi Tc-31m MAA COMPARISON:  Chest radiograph 10/28/2022,  CT angiogram 11/06/2022 FINDINGS: There multiple bilateral small peripheral perfusion defects. Decreased perfusion to the RIGHT lower lobe in regional pattern. Regional decreased perfusion LEFT lower lobe additionally. CTA 1 day prior demonstrated normal pulmonary parenchyma. Additionally there is no pulmonary embolism at that time. IMPRESSION: Abnormal perfusion pattern with several small perfusion defects and regional decreased perfusion to the lower lobes. No evidence of a pulmonary parenchymal abnormality or pulmonary embolism on CTA 1 day prior. Equivocal findings for acute pulmonary embolism. Consider Doppler ultrasound of the lower extremities and repeat CTA if patient's renal function improves. Electronically Signed   By: Genevive Bi M.D.   On: 11/06/2022 12:27   DG Chest Port 1 View  Result Date: 11/05/2022 CLINICAL DATA:  Shortness of breath x2 days EXAM: PORTABLE CHEST 1 VIEW COMPARISON:  11/10/2014 FINDINGS: Cardiac size is within normal limits. There are no signs of pulmonary edema. There is no focal pulmonary consolidation. Left hemidiaphragm is elevated. There is crowding of markings in the lateral aspect of left lower lung field. There is blunting of left lateral CP angle. There is no pneumothorax. IMPRESSION: Increased markings in lateral aspect of left lower lung fields may suggest crowding of bronchovascular structures due to elevation of left hemidiaphragm or small focus of atelectasis/pneumonia. Blunting of left lateral CP angle suggests minimal effusion or pleural thickening. Electronically Signed  By: Ernie Avena M.D.   On: 11/05/2022 19:34    Microbiology: Results for orders placed or performed during the hospital encounter of 11/06/22  Resp panel by RT-PCR (RSV, Flu A&B, Covid) Anterior Nasal Swab     Status: Abnormal   Collection Time: 11/05/22  7:24  PM   Specimen: Anterior Nasal Swab  Result Value Ref Range Status   SARS Coronavirus 2 by RT PCR NEGATIVE NEGATIVE Final    Comment: (NOTE) SARS-CoV-2 target nucleic acids are NOT DETECTED.  The SARS-CoV-2 RNA is generally detectable in upper respiratory specimens during the acute phase of infection. The lowest concentration of SARS-CoV-2 viral copies this assay can detect is 138 copies/mL. A negative result does not preclude SARS-Cov-2 infection and should not be used as the sole basis for treatment or other patient management decisions. A negative result may occur with  improper specimen collection/handling, submission of specimen other than nasopharyngeal swab, presence of viral mutation(s) within the areas targeted by this assay, and inadequate number of viral copies(<138 copies/mL). A negative result must be combined with clinical observations, patient history, and epidemiological information. The expected result is Negative.  Fact Sheet for Patients:  BloggerCourse.com  Fact Sheet for Healthcare Providers:  SeriousBroker.it  This test is no t yet approved or cleared by the [place] FDA and  has been authorized for detection and/or diagnosis of SARS-CoV-2 by FDA under an Emergency Use Authorization (EUA). This EUA will remain  in effect (meaning this test can be used) for the duration of the COVID-19 declaration under Section 564(b)(1) of the Act, 21 U.S.C.section 360bbb-3(b)(1), unless the authorization is terminated  or revoked sooner.       Influenza A by PCR POSITIVE (A) NEGATIVE Final   Influenza B by PCR NEGATIVE NEGATIVE Final    Comment: (NOTE) The Xpert Xpress SARS-CoV-2/FLU/RSV plus assay is intended as an aid in the diagnosis of influenza from Nasopharyngeal swab specimens and should not be used as a sole basis for treatment. Nasal washings and aspirates are unacceptable for Xpert Xpress  SARS-CoV-2/FLU/RSV testing.  Fact Sheet for Patients: BloggerCourse.com  Fact Sheet for Healthcare Providers: SeriousBroker.it  This test is not yet approved or cleared by the [place] FDA and has been authorized for detection and/or diagnosis of SARS-CoV-2 by FDA under an Emergency Use Authorization (EUA). This EUA will remain in effect (meaning this test can be used) for the duration of the COVID-19 declaration under Section 564(b)(1) of the Act, 21 U.S.C. section 360bbb-3(b)(1), unless the authorization is terminated or revoked.     Resp Syncytial Virus by PCR NEGATIVE NEGATIVE Final    Comment: (NOTE) Fact Sheet for Patients: BloggerCourse.com  Fact Sheet for Healthcare Providers: SeriousBroker.it  This test is not yet approved or cleared by the [place] FDA and has been authorized for detection and/or diagnosis of SARS-CoV-2 by FDA under an Emergency Use Authorization (EUA). This EUA will remain in effect (meaning this test can be used) for the duration of the COVID-19 declaration under Section 564(b)(1) of the Act, 21 U.S.C. section 360bbb-3(b)(1), unless the authorization is terminated or revoked.  Performed at [facility], [address] [place], [place] [address]     Labs: CBC: Recent Labs  Lab 11/05/22 1924 11/06/22 0758  WBC 7.3 4.5  HGB 13.2 11.7*  HCT 41.7 36.3*  MCV 90.7 88.8  PLT 253 235   Basic Metabolic Panel: Recent Labs  Lab 11/05/22 1924 11/06/22 0758 11/06/22 1134  NA 134*  --  133*  K 3.8  --  3.3*  CL 98  --  102  CO2 28  --  25  GLUCOSE 107*  --  110*  BUN 20  --  19  CREATININE 2.09* 1.56* 1.50*  CALCIUM 8.9  --  8.2*  PHOS  --   --  2.5   Liver Function Tests: Recent Labs  Lab 11/05/22 1924 11/06/22 1134  AST 29  --   ALT 14  --   ALKPHOS 79  --   BILITOT 0.8  --   PROT 7.6  --   ALBUMIN 3.9 3.4*    CBG: No results for input(s): "GLUCAP" in the last 168 hours.  Discharge time spent: greater than 30 minutes.  Signed: Almon Herculesaye T Gurvir Schrom, MD [facility] 11/06/2022

## 2022-11-06 NOTE — H&P (Signed)
History and Physical    Patient: Trevor Harmon [id number] DOB: 1966-10-02 DOA: 11/06/2022 DOS: the patient was seen and examined on 11/06/2022 PCP: Cletis Athens, MD  Patient coming from:  [facility]  Chief Complaint:  Chief Complaint  Patient presents with   Shortness of Breath   HPI: Trevor Harmon is a 56 y.o. 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  He works at [employer].  Patient reports productive cough with "little" phlegm.  He denies hemoptysis.  He denies dizziness, palpitation, chest pain, edema, GI or UTI symptoms. He had some runny nose and sore throat for few days prior to presentation.  It seems he has history of asthma/COPD but inconsistently uses his inhalers.  He states he quit smoking about 11 days ago.  He denies drinking alcohol or using recreational drug.  In ED, febrile to 100.7.  Tachycardic to 102 but improved to 70s.  Normotensive.  Saturating in upper 90s to 100% on RA.  CMP and CBC without significant finding other than mildly elevated creatinine to 2.09 (baseline about 1.4-1.6)..  Troponin 9.  Pro-Cal 0.15.  Influenza A positive.  COVID-19 and RSV PCR nonreactive.  D-dimer elevated to 1.87.  CXR with increased markings in lateral aspect of LLL and blunting of left lateral costophrenic angle suggesting minimal effusion.  CTA chest done but has not resulted.  VQ scan ordered and showed abnormal perfusion pattern with several small perfusion defects and regional decreased perfusion to the lower lobes.  LE venous Doppler was recommended.  Hospitalist service called for admission for AKI and influenza A infection.   Review of Systems: As mentioned in the history of present illness. All other systems reviewed and are negative. Past Medical History:  Diagnosis Date   GERD (gastroesophageal reflux disease)    Hypertension     Past Surgical History:  Procedure Laterality Date   kidney stent     OTHER SURGICAL HISTORY     stent kidney   Social History:  reports that he has quit smoking. His smoking use included cigarettes. He smoked an average of 1 pack per day. He has never used smokeless tobacco. He reports that he does not drink alcohol and does not use drugs.  No Known Allergies  History reviewed. No pertinent family history.  Prior to Admission medications   Medication Sig Start Date End Date Taking? Authorizing Provider  albuterol (VENTOLIN HFA) 108 (90 Base) MCG/ACT inhaler Inhale 2 puffs into the lungs every 6 (six) hours as needed for wheezing or shortness of breath. 11/06/22  Yes Mercy Riding, MD  diclofenac Sodium (VOLTAREN) 1 % GEL Apply 2 g topically 2 (two) times daily. 04/09/22  Yes Theresia Lo, NP  ondansetron (ZOFRAN-ODT) 4 MG disintegrating tablet Take 1 tablet (4 mg total) by mouth every 6 (six) hours as needed for nausea or vomiting. 11/06/22  Yes Ward, Cyril Mourning N, DO  pravastatin (PRAVACHOL) 40 MG tablet TAKE 1 TABLET BY MOUTH DAILY 09/21/22  Yes Theresia Lo, NP  propranolol (INDERAL) 20 MG tablet TAKE 1 TABLET BY MOUTH TWICE A DAY 07/14/22  Yes Masoud, Viann Shove, MD  traZODone (DESYREL) 100 MG tablet TAKE 1 TABLET BY MOUTH AT BEDTIME 07/14/22  Yes Masoud, Viann Shove, MD  ARIPiprazole (ABILIFY) 10 MG tablet Take 10 mg by mouth daily. Patient not taking: Reported on 11/06/2022    [provider]  Fluticasone-Salmeterol (ADVAIR) 250-50 MCG/DOSE AEPB Inhale 1 puff into the lungs 2 (two) times daily. 11/06/22   Mercy Riding, MD  loratadine (CLARITIN) 10 MG tablet Take 1 tablet (10 mg total) by mouth daily. Patient not taking: Reported on 11/06/2022 04/29/21   Cletis Athens, MD  oseltamivir (TAMIFLU) 30 MG capsule Take 1 capsule (30 mg total) by mouth 2 (two) times daily for 5 days. 11/06/22 11/11/22  Mercy Riding, MD  ranitidine (ZANTAC) 150 MG tablet Take 150 mg by mouth 2 (two) times  daily. Patient not taking: Reported on 11/06/2022    [provider]  triamterene-hydrochlorothiazide (MAXZIDE-25) 37.5-25 MG tablet Take 1 tablet by mouth daily. 11/13/22   Mercy Riding, MD    Physical Exam: Vitals:   11/05/22 2340 11/06/22 0452 11/06/22 0830 11/06/22 1142  BP: (!) 116/98  (!) 140/81 138/78  Pulse: 77  75 70  Resp: 18  18 18   Temp:  98.9 F (37.2 C)  98 F (36.7 C)  TempSrc:  Oral  Oral  SpO2: 100%  95% 95%  Weight:      Height:       GENERAL: No apparent distress.  Nontoxic. HEENT: MMM.  Vision and hearing grossly intact.  NECK: Supple.  No apparent JVD.  RESP:  No IWOB.  Fair aeration bilaterally. CVS:  RRR. Heart sounds normal.  ABD/GI/GU: BS+. Abd soft, NTND.  MSK/EXT:  Moves extremities. No apparent deformity. No edema.  SKIN: no apparent skin lesion or wound NEURO: Awake and alert. Oriented x 4 except year.  No apparent focal neuro deficit. PSYCH: Calm. Normal affect.   Data Reviewed: See HPI  Assessment and Plan: Principal Problem:   Acute kidney injury superimposed on chronic kidney disease ([place]) Active Problems:   Essential hypertension   Mood disorder ([place])   Chronic obstructive airway disease (HCC)   Influenza A H1N1 infection   Abnormal perfusion scan of lung   AKI on CKD-3A: Likely prerenal with concurrent use of Maxzide and NSAID.  Baseline Cr about 1.5.  Resolved. Recent Labs    11/05/22 1924 11/06/22 0758 11/06/22 1134  BUN 20  --  19  CREATININE 2.09* 1.56* 1.50*  -Recommended holding his Maxide -Check renal function in 1 week.  Influenza A infection: No significant respiratory symptoms other than some cough with clear phlegm. -Discharged on Tamiflu 30 mg twice daily -Refilled his inhalers.  Chronic asthma/COPD: No exacerbation. -Refilled inhalers.  Essential hypertension: Normotensive for most part. -Advised to hold his Maxide for about a week -Reassess BP and renal function at follow-up.    Acute shortness  of breath/cough/fever: Likely due to influenza.  Abnormal perfusion scan of lung: Showed severe small perfusion defects and regional decreased perfusion to lower lobes.  CTA chest negative for PE.  Lower extremity venous Doppler negative for DVT.  Mediastinal lymphadenopathy: Chronic.  No change.  Mood disorder -Continue home medications.  Tobacco use disorder: Reports quitting smoking about 11 days ago. -Congratulated.    Advance Care Planning:   Code Status: Full Code   Consults: None  Family Communication: None at bedside  Severity of Illness: The appropriate patient status for this patient is OBSERVATION. Observation status is judged to be reasonable and necessary in order to provide the required intensity of service to ensure the patient's safety. The patient's presenting symptoms, physical exam findings, and initial radiographic and laboratory data in the context of their medical condition is felt to place them at decreased risk for further clinical deterioration.  Furthermore, it is anticipated that the patient will be medically stable for discharge from the hospital within 2 midnights of admission.   Author: Almon Hercules, MD 11/06/2022 4:38 PM  For on call review [email or web address].

## 2022-11-06 NOTE — ED Provider Notes (Signed)
[facility] Provider Note    Event Date/Time   First MD Initiated Contact with Patient 11/06/22 [phone]     (approximate)   History   Shortness of Breath   HPI  Trevor Harmon is a 56 y.o. male with history of hypertension, hyperlipidemia, chronic kidney disease, GERD who presents to the emergency department with complaints of fevers, cough and shortness of breath that started today while at work.  No chest pain.  No vomiting or diarrhea.   History provided by patient and patient's caregiver by phone.    Past Medical History:  Diagnosis Date   GERD (gastroesophageal reflux disease)    Hypertension     Past Surgical History:  Procedure Laterality Date   kidney stent     OTHER SURGICAL HISTORY     stent kidney    MEDICATIONS:  Prior to Admission medications   Medication Sig Start Date End Date Taking? Authorizing Provider  ARIPiprazole (ABILIFY) 10 MG tablet Take 10 mg by mouth daily.    [provider]  cyclobenzaprine (FLEXERIL) 5 MG tablet Take 1 tablet (5 mg total) by mouth 3 (three) times daily as needed for muscle spasms. 03/09/22   Corky Downs, MD  diclofenac Sodium (VOLTAREN) 1 % GEL Apply 2 g topically 2 (two) times daily. 04/09/22   Kara Dies, NP  Fluticasone-Salmeterol (ADVAIR) 250-50 MCG/DOSE AEPB Inhale 1 puff into the lungs 2 (two) times daily.    [provider]  ibuprofen (ADVIL) 800 MG tablet Take 1 tablet (800 mg total) by mouth every 8 (eight) hours as needed. 04/09/22   Kara Dies, NP  loratadine (CLARITIN) 10 MG tablet Take 1 tablet (10 mg total) by mouth daily. 04/29/21   Corky Downs, MD  losartan-hydrochlorothiazide (HYZAAR) 50-12.5 MG tablet Take 1 tablet by mouth daily. 06/24/21   Corky Downs, MD  pravastatin (PRAVACHOL) 40 MG tablet TAKE 1 TABLET BY MOUTH DAILY 09/21/22   Kara Dies, NP  propranolol (INDERAL) 20 MG tablet TAKE 1 TABLET BY MOUTH TWICE A DAY 07/14/22   Corky Downs, MD   ranitidine (ZANTAC) 150 MG tablet Take 150 mg by mouth 2 (two) times daily.    [provider]  traZODone (DESYREL) 100 MG tablet TAKE 1 TABLET BY MOUTH AT BEDTIME 07/14/22   Corky Downs, MD  triamterene-hydrochlorothiazide (MAXZIDE-25) 37.5-25 MG tablet TAKE 1 TABLET BY MOUTH DAILY 09/21/22   Kara Dies, NP    Physical Exam   Triage Vital Signs: ED Triage Vitals  Enc Vitals Group     BP 11/05/22 1911 107/79     Pulse Rate 11/05/22 1911 (!) 102     Resp 11/05/22 1911 19     Temp 11/05/22 1911 (!) 100.7 F (38.2 C)     Temp Source 11/05/22 1911 Oral     SpO2 11/05/22 1911 96 %     Weight 11/05/22 1915 150 lb (68 kg)     Height 11/05/22 1915 6' (1.829 m)     Head Circumference --      Peak Flow --      Pain Score 11/05/22 1915 0     Pain Loc --      Pain Edu? --      Excl. in GC? --     Most recent vital signs: Vitals:   11/05/22 2340 11/06/22 0452  BP: (!) 116/98   Pulse: 77   Resp: 18   Temp:  98.9 F (37.2 C)  SpO2: 100%  CONSTITUTIONAL: Alert and oriented and responds appropriately to questions. Well-appearing; well-nourished HEAD: Normocephalic, atraumatic EYES: Conjunctivae clear, pupils appear equal, sclera nonicteric ENT: normal nose; moist mucous membranes NECK: Supple, normal ROM CARD: RRR; S1 and S2 appreciated; no murmurs, no clicks, no rubs, no gallops RESP: Normal chest excursion without splinting or tachypnea; breath sounds clear and equal bilaterally; no wheezes, no rhonchi, no rales, no hypoxia or respiratory distress, speaking full sentences ABD/GI: Normal bowel sounds; non-distended; soft, non-tender, no rebound, no guarding, no peritoneal signs BACK: The back appears normal EXT: Normal ROM in all joints; no deformity noted, no edema; no cyanosis SKIN: Normal color for age and race; warm; no rash on exposed skin NEURO: Moves all extremities equally, normal speech PSYCH: The patient's mood and manner are appropriate.   ED  Results / Procedures / Treatments   LABS: (all labs ordered are listed, but only abnormal results are displayed) Labs Reviewed  RESP PANEL BY RT-PCR (RSV, FLU A&B, COVID)  RVPGX2 - Abnormal; Notable for the following components:      Result Value   Influenza A by PCR POSITIVE (*)    All other components within normal limits  COMPREHENSIVE METABOLIC PANEL - Abnormal; Notable for the following components:   Sodium 134 (*)    Glucose, Bld 107 (*)    Creatinine, Ser 2.09 (*)    GFR, Estimated 37 (*)    All other components within normal limits  D-DIMER, QUANTITATIVE - Abnormal; Notable for the following components:   D-Dimer, Quant 1.87 (*)    All other components within normal limits  CBC  URINALYSIS, ROUTINE W REFLEX MICROSCOPIC  PROCALCITONIN  TROPONIN I (HIGH SENSITIVITY)     EKG:  EKG Interpretation  Date/Time:  Thursday November 05 2022 19:15:09 EST Ventricular Rate:  105 PR Interval:  154 QRS Duration: 82 QT Interval:  312 QTC Calculation: 412 R Axis:   91 Text Interpretation: Sinus tachycardia Biatrial enlargement Rightward axis Pulmonary disease pattern Abnormal ECG When compared with ECG of 10-Nov-2014 08:54, Vent. rate has increased BY  54 BPM ST no longer elevated in Lateral leads T wave amplitude has decreased in Lateral leads QT has lengthened Confirmed by Ermalinda Joubert, Cyril Mourning [phone]) on 11/06/2022 3:53:38 AM         RADIOLOGY: My personal review and interpretation of imaging: Chest x-ray shows atelectasis versus pneumonia.  I have personally reviewed all radiology reports.   DG Chest Port 1 View  Result Date: 11/05/2022 CLINICAL DATA:  Shortness of breath x2 days EXAM: PORTABLE CHEST 1 VIEW COMPARISON:  11/10/2014 FINDINGS: Cardiac size is within normal limits. There are no signs of pulmonary edema. There is no focal pulmonary consolidation. Left hemidiaphragm is elevated. There is crowding of markings in the lateral aspect of left lower lung field. There is  blunting of left lateral CP angle. There is no pneumothorax. IMPRESSION: Increased markings in lateral aspect of left lower lung fields may suggest crowding of bronchovascular structures due to elevation of left hemidiaphragm or small focus of atelectasis/pneumonia. Blunting of left lateral CP angle suggests minimal effusion or pleural thickening. Electronically Signed   By: Elmer Picker M.D.   On: 11/05/2022 19:34     PROCEDURES:  Critical Care performed: No      Procedures    IMPRESSION / MDM / ASSESSMENT AND PLAN / ED COURSE  I reviewed the triage vital signs and the nursing notes.    Patient here with fever, cough, shortness of breath.  The patient is on  the cardiac monitor to evaluate for evidence of arrhythmia and/or significant heart rate changes.   DIFFERENTIAL DIAGNOSIS (includes but not limited to):   Viral URI, pneumonia, less likely sepsis, bacteremia, ACS, PE, CHF   Patient's presentation is most consistent with acute presentation with potential threat to life or bodily function.   PLAN: Workup initiated from triage.  No leukocytosis or leukopenia.  Creatinine is 2.09.  Last creatinine at [facility] on 06/17/2022 was 1.3.  Will give IV fluids for AKI.  He denies any vomiting or diarrhea.  I question if there is some intellectual disability as patient seems to have a hard time answering questions and comprehending treatment plan.  We did talk to the person that he calls his father who is patient's pastor who he lives with.  This caregiver agreed with admission for AKI.  Patient did test positive for influenza and chest x-ray reviewed and interpreted by myself and the radiologist shows possible atelectasis versus early pneumonia.  Suspect that this is viral in nature but will add on a procalcitonin to rule out bacterial infection.  He has no hypoxia.  In triage D-dimer was obtained that came back at 1.87.  I have low suspicion for PE but given this elevated D-dimer and  elevated creatinine, he will need a VQ scan in the morning as he cannot have a CT angio.   MEDICATIONS GIVEN IN ED: Medications  acetaminophen (TYLENOL) tablet 650 mg (650 mg Oral Given 11/05/22 1920)  iohexol (OMNIPAQUE) 350 MG/ML injection 75 mL (75 mLs Intravenous Contrast Given 11/05/22 2357)  sodium chloride 0.9 % bolus 1,000 mL (1,000 mLs Intravenous New Bag/Given 11/06/22 0407)  sodium chloride 0.9 % bolus 1,000 mL (1,000 mLs Intravenous New Bag/Given 11/06/22 0407)     ED COURSE: Will add on troponin and procalcitonin.   CONSULTS:  Consulted and discussed patient's case with hospitalist, Dr. Sidney Ace.  I have recommended admission and consulting physician agrees and will place admission orders.  Patient (and family if present) agree with this plan.   I reviewed all nursing notes, vitals, pertinent previous records.  All labs, EKGs, imaging ordered have been independently reviewed and interpreted by myself.    OUTSIDE RECORDS REVIEWED: Reviewed last [facility] nephrology note in 2018.       FINAL CLINICAL IMPRESSION(S) / ED DIAGNOSES   Final diagnoses:  Influenza A     Rx / DC Orders   ED Discharge Orders          Ordered    oseltamivir (TAMIFLU) 75 MG capsule  2 times daily,   Status:  Discontinued        11/06/22 0350    ondansetron (ZOFRAN-ODT) 4 MG disintegrating tablet  Every 6 hours PRN        11/06/22 0350             Note:  This document was prepared using Dragon voice recognition software and may include unintentional dictation errors.   Shine Mikes, Delice Bison, DO 11/06/22 [phone]

## 2022-11-07 LAB — URINE CULTURE: Culture: 80000 — AB

## 2022-11-08 NOTE — Progress Notes (Signed)
ED Antimicrobial Stewardship Positive Culture Follow Up   Trevor Harmon is an 56 y.o. male who presented to [facility] on 11/06/2022 with a chief complaint of  Chief Complaint  Patient presents with   Shortness of Breath    Recent Results (from the past 720 hour(s))  Resp panel by RT-PCR (RSV, Flu A&B, Covid) Anterior Nasal Swab     Status: Abnormal   Collection Time: 11/05/22  7:24 PM   Specimen: Anterior Nasal Swab  Result Value Ref Range Status   SARS Coronavirus 2 by RT PCR NEGATIVE NEGATIVE Final    Comment: (NOTE) SARS-CoV-2 target nucleic acids are NOT DETECTED.  The SARS-CoV-2 RNA is generally detectable in upper respiratory specimens during the acute phase of infection. The lowest concentration of SARS-CoV-2 viral copies this assay can detect is 138 copies/mL. A negative result does not preclude SARS-Cov-2 infection and should not be used as the sole basis for treatment or other patient management decisions. A negative result may occur with  improper specimen collection/handling, submission of specimen other than nasopharyngeal swab, presence of viral mutation(s) within the areas targeted by this assay, and inadequate number of viral copies(<138 copies/mL). A negative result must be combined with clinical observations, patient history, and epidemiological information. The expected result is Negative.  Fact Sheet for Patients:  BloggerCourse.com  Fact Sheet for Healthcare Providers:  SeriousBroker.it  This test is no t yet approved or cleared by the [place] FDA and  has been authorized for detection and/or diagnosis of SARS-CoV-2 by FDA under an Emergency Use Authorization (EUA). This EUA will remain  in effect (meaning this test can be used) for the duration of the COVID-19 declaration under Section 564(b)(1) of the Act, 21 U.S.C.section 360bbb-3(b)(1), unless the authorization is terminated  or revoked sooner.        Influenza A by PCR POSITIVE (A) NEGATIVE Final   Influenza B by PCR NEGATIVE NEGATIVE Final    Comment: (NOTE) The Xpert Xpress SARS-CoV-2/FLU/RSV plus assay is intended as an aid in the diagnosis of influenza from Nasopharyngeal swab specimens and should not be used as a sole basis for treatment. Nasal washings and aspirates are unacceptable for Xpert Xpress SARS-CoV-2/FLU/RSV testing.  Fact Sheet for Patients: BloggerCourse.com  Fact Sheet for Healthcare Providers: SeriousBroker.it  This test is not yet approved or cleared by the [place] FDA and has been authorized for detection and/or diagnosis of SARS-CoV-2 by FDA under an Emergency Use Authorization (EUA). This EUA will remain in effect (meaning this test can be used) for the duration of the COVID-19 declaration under Section 564(b)(1) of the Act, 21 U.S.C. section 360bbb-3(b)(1), unless the authorization is terminated or revoked.     Resp Syncytial Virus by PCR NEGATIVE NEGATIVE Final    Comment: (NOTE) Fact Sheet for Patients: BloggerCourse.com  Fact Sheet for Healthcare Providers: SeriousBroker.it  This test is not yet approved or cleared by the [place] FDA and has been authorized for detection and/or diagnosis of SARS-CoV-2 by FDA under an Emergency Use Authorization (EUA). This EUA will remain in effect (meaning this test can be used) for the duration of the COVID-19 declaration under Section 564(b)(1) of the Act, 21 U.S.C. section 360bbb-3(b)(1), unless the authorization is terminated or revoked.  Performed at [facility], [address] [place], [place] [address]   Urine Culture     Status: Abnormal   Collection Time: 11/06/22  6:18 AM   Specimen: Urine, Clean Catch  Result Value Ref Range Status  Specimen Description   Final    URINE, CLEAN CATCH Performed at [facility], [address], [place], [place] [address]    Special Requests   Final    NONE Performed at [facility], [address] [place], [place] [address]    Culture (A)  Final    80,000 COLONIES/mL STREPTOCOCCUS AGALACTIAE TESTING AGAINST S. AGALACTIAE NOT ROUTINELY PERFORMED DUE TO PREDICTABILITY OF AMP/PEN/VAN SUSCEPTIBILITY. Performed at [facility], [address] [place], [place] [address]    Report Status 11/07/2022 FINAL  Final    New antibiotic prescription: None Pt's presentation attributable to Flu A(+), and patient discharged on Tamiflu. No urinary symptoms and further treatment is not warranted at this time.  ED Provider: Dionne Bucy  Will M. Dareen Piano, PharmD PGY-1 Pharmacy Resident 11/08/2022 1:50 PM

## 2023-02-12 ENCOUNTER — Emergency Department
Admission: EM | Admit: 2023-02-12 | Discharge: 2023-02-12 | Disposition: A | Discharge status: Home / Self Care | Payer: 59 | Attending: Emergency Medicine | Admitting: Emergency Medicine

## 2023-02-12 ENCOUNTER — Other Ambulatory Visit: Payer: Self-pay

## 2023-02-12 DIAGNOSIS — W260XXA Contact with knife, initial encounter: Secondary | ICD-10-CM | POA: Diagnosis not present

## 2023-02-12 DIAGNOSIS — S61210A Laceration without foreign body of right index finger without damage to nail, initial encounter: Secondary | ICD-10-CM | POA: Diagnosis not present

## 2023-02-12 DIAGNOSIS — S6991XA Unspecified injury of right wrist, hand and finger(s), initial encounter: Secondary | ICD-10-CM | POA: Diagnosis present

## 2023-02-12 DIAGNOSIS — Z23 Encounter for immunization: Secondary | ICD-10-CM | POA: Insufficient documentation

## 2023-02-12 MED ORDER — CEPHALEXIN 500 MG PO CAPS
500.0000 mg | ORAL_CAPSULE | Freq: Once | ORAL | Status: AC
  Administered 2023-02-12: 500 mg via ORAL
  Filled 2023-02-12: qty 1

## 2023-02-12 MED ORDER — CEPHALEXIN 500 MG PO CAPS: 500.0000 mg | ORAL_CAPSULE | Freq: Four times a day (QID) | ORAL | 0 refills | Status: AC

## 2023-02-12 MED ORDER — TETANUS-DIPHTH-ACELL PERTUSSIS 5-2.5-18.5 LF-MCG/0.5 IM SUSY
0.5000 mL | PREFILLED_SYRINGE | Freq: Once | INTRAMUSCULAR | Status: AC
  Administered 2023-02-12: 0.5 mL via INTRAMUSCULAR
  Filled 2023-02-12: qty 0.5

## 2023-02-12 MED ORDER — TETANUS-DIPHTHERIA TOXOIDS TD 5-2 LFU IM INJ
0.5000 mL | INJECTION | Freq: Once | INTRAMUSCULAR | Status: DC
  Filled 2023-02-12: qty 0.5

## 2023-02-12 NOTE — ED Triage Notes (Signed)
Pt to ED via POV c/o finger laceration. Pt cut index finger with knife. Bleeding under control at this time, wrapped with gauze and coband. Tetanus shot not up to date.
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
## 2023-02-12 NOTE — Discharge Instructions (Signed)
Keep laceration site clean and dry during the day.  You may shower and get wet but do not submerge underwater.  Do not exposed to dirty dishwater.  Cleanse daily with alcohol and apply Band-Aid to keep clean during the day.  Take antibiotic as prescribed.  Follow-up with primary care provider, walk-in clinic in 8 to 10 days for suture removal

## 2023-02-16 ENCOUNTER — Telehealth: Payer: Self-pay

## 2023-02-16 NOTE — Telephone Encounter (Signed)
     Patient  visit on 02/12/2023  at [facility] was for laceration of right finger.  Have you been able to follow up with your primary care physician? Patient is feeling better.  The patient was or was not able to obtain any needed medicine or equipment. Patient was able to obtain medication.  Are there diet recommendations that you are having difficulty following? No  Patient expresses understanding of discharge instructions and education provided has no other needs at this time. Yes   [facility]rev[facility]  [facility] Population Health Community Resource Care Guide   [email or web address]  ?? [phone]   Website: [email or web address]  [email or web address]

## 2023-03-08 ENCOUNTER — Emergency Department
Admission: EM | Admit: 2023-03-08 | Discharge: 2023-03-08 | Disposition: A | Discharge status: Home / Self Care | Payer: 59 | Attending: Emergency Medicine | Admitting: Emergency Medicine

## 2023-03-08 ENCOUNTER — Other Ambulatory Visit: Payer: Self-pay

## 2023-03-08 DIAGNOSIS — X58XXXA Exposure to other specified factors, initial encounter: Secondary | ICD-10-CM | POA: Insufficient documentation

## 2023-03-08 DIAGNOSIS — S0502XA Injury of conjunctiva and corneal abrasion without foreign body, left eye, initial encounter: Secondary | ICD-10-CM | POA: Diagnosis not present

## 2023-03-08 DIAGNOSIS — I10 Essential (primary) hypertension: Secondary | ICD-10-CM | POA: Diagnosis not present

## 2023-03-08 MED ORDER — TETRACAINE HCL 0.5 % OP SOLN
2.0000 [drp] | Freq: Once | OPHTHALMIC | Status: DC
  Filled 2023-03-08: qty 4

## 2023-03-08 MED ORDER — KETOROLAC TROMETHAMINE 0.5 % OP SOLN: 1.0000 [drp] | Freq: Four times a day (QID) | OPHTHALMIC | 0 refills | Status: AC

## 2023-03-08 MED ORDER — FLUORESCEIN SODIUM 1 MG OP STRP
1.0000 | ORAL_STRIP | Freq: Once | OPHTHALMIC | Status: DC
  Filled 2023-03-08: qty 1

## 2023-03-08 MED ORDER — SULFACETAMIDE SODIUM 10 % OP SOLN: 2.0000 [drp] | Freq: Four times a day (QID) | OPHTHALMIC | 0 refills | Status: AC

## 2023-03-08 NOTE — ED Triage Notes (Signed)
Pt states he was weed eating his lawn, the cord hit a rock and the rock hit his left eye. Pt states this was this past Saturday. Pt states some blurred vision

## 2023-03-08 NOTE — Discharge Instructions (Signed)
Your tetanus is up-to-date.  You got a update in April 2024. Use the medication as prescribed.  Follow-up with [facility].  Please call them for an appointment. Return emergency department worsening

## 2023-03-08 NOTE — ED Provider Notes (Signed)
[facility] Provider Note    Event Date/Time   First MD Initiated Contact with Patient 03/08/23 0920     (approximate)   History   Eye Problem   HPI  Trevor Harmon is a 57 y.o. male with history of hypertension presents emergency department with left eye pain.  Patient was mowing the grass and weed eating when he hit a rock approximately 8 days ago.  Tdap up-to-date.  States he continues to have pain and blurred vision.  No other injury.      Physical Exam   Triage Vital Signs: ED Triage Vitals  Enc Vitals Group     BP 03/08/23 0914 (!) 151/91     Pulse Rate 03/08/23 0914 (!) 56     Resp 03/08/23 0914 17     Temp 03/08/23 0914 97.7 F (36.5 C)     Temp src --      SpO2 03/08/23 0914 99 %     Weight --      Height 03/08/23 0915 6' (1.829 m)     Head Circumference --      Peak Flow --      Pain Score 03/08/23 0915 10     Pain Loc --      Pain Edu? --      Excl. in GC? --     Most recent vital signs: Vitals:   03/08/23 0914  BP: (!) 151/91  Pulse: (!) 56  Resp: 17  Temp: 97.7 F (36.5 C)  SpO2: 99%     General: Awake, no distress.   CV:  Good peripheral perfusion. regular rate and  rhythm Resp:  Normal effort.  Abd:  No distention.   Other:  PERRL, EOMI, conjunctiva injected, fluorescein stain/tetracaine to the left eye shows a small corneal abrasion   ED Results / Procedures / Treatments   Labs (all labs ordered are listed, but only abnormal results are displayed) Labs Reviewed - No data to display   EKG     RADIOLOGY     PROCEDURES:   Procedures   MEDICATIONS ORDERED IN ED: Medications  tetracaine (PONTOCAINE) 0.5 % ophthalmic solution 2 drop (has no administration in time range)  fluorescein ophthalmic strip 1 strip (has no administration in time range)     IMPRESSION / MDM / ASSESSMENT AND PLAN / ED COURSE  I reviewed the triage vital signs and the nursing notes.                               Differential diagnosis includes, but is not limited to, corneal abrasion, conjunctivitis, glaucoma  Patient's presentation is most consistent with Patient's presentation is most consistent with acute presentation with potential threat to life or bodily function.  Do not feel that the patient's symptoms and exam correlate with glaucoma.  Therefore fluorescein stain/tetracaine administered.  Is a small corneal abrasion, and conjunctivae are injected.  Will place patient on antibiotic drops along with Toradol ophthalmic drops.  Strict instructions to follow-up with ophthalmology.  His Tdap is up-to-date per his past medical records.  He is in agreement treatment plan.  Discharged stable condition.      FINAL CLINICAL IMPRESSION(S) / ED DIAGNOSES   Final diagnoses:  Abrasion of left cornea, initial encounter     Rx / DC Orders   ED Discharge Orders          Ordered    sulfacetamide (BLEPH-10)  10 % ophthalmic solution  4 times daily        03/08/23 0931    ketorolac (ACULAR) 0.5 % ophthalmic solution  4 times daily        03/08/23 1610             Note:  This document was prepared using Dragon voice recognition software and may include unintentional dictation errors.    Faythe Ghee, PA-C 03/08/23 0935    Jene Every, MD 03/08/23 [phone]

## 2023-03-09 DIAGNOSIS — H209 Unspecified iridocyclitis: Secondary | ICD-10-CM | POA: Diagnosis not present

## 2023-03-09 DIAGNOSIS — H179 Unspecified corneal scar and opacity: Secondary | ICD-10-CM | POA: Diagnosis not present

## 2023-03-16 DIAGNOSIS — H209 Unspecified iridocyclitis: Secondary | ICD-10-CM | POA: Diagnosis not present

## 2023-03-16 DIAGNOSIS — H179 Unspecified corneal scar and opacity: Secondary | ICD-10-CM | POA: Diagnosis not present

## 2023-06-25 ENCOUNTER — Other Ambulatory Visit: Payer: Self-pay | Admitting: Nurse Practitioner

## 2023-07-28 DIAGNOSIS — E785 Hyperlipidemia, unspecified: Secondary | ICD-10-CM | POA: Diagnosis not present

## 2023-07-28 DIAGNOSIS — G479 Sleep disorder, unspecified: Secondary | ICD-10-CM | POA: Diagnosis not present

## 2023-10-05 DIAGNOSIS — E785 Hyperlipidemia, unspecified: Secondary | ICD-10-CM | POA: Diagnosis not present

## 2023-10-05 DIAGNOSIS — G479 Sleep disorder, unspecified: Secondary | ICD-10-CM | POA: Diagnosis not present

## 2023-11-16 ENCOUNTER — Ambulatory Visit
Admission: RE | Admit: 2023-11-16 | Discharge: 2023-11-16 | Disposition: A | Discharge status: Home / Self Care | Payer: 59 | Source: Ambulatory Visit | Attending: Internal Medicine | Admitting: Internal Medicine

## 2023-11-16 ENCOUNTER — Other Ambulatory Visit: Payer: Self-pay | Admitting: Internal Medicine

## 2023-11-16 ENCOUNTER — Ambulatory Visit
Admission: RE | Admit: 2023-11-16 | Discharge: 2023-11-16 | Disposition: A | Discharge status: Home / Self Care | Payer: 59 | Attending: Internal Medicine | Admitting: Internal Medicine

## 2023-11-16 DIAGNOSIS — M25561 Pain in right knee: Secondary | ICD-10-CM | POA: Diagnosis not present

## 2023-11-16 DIAGNOSIS — M1711 Unilateral primary osteoarthritis, right knee: Secondary | ICD-10-CM | POA: Diagnosis not present

## 2023-11-16 DIAGNOSIS — G8929 Other chronic pain: Secondary | ICD-10-CM | POA: Diagnosis not present

## 2023-11-16 DIAGNOSIS — M25461 Effusion, right knee: Secondary | ICD-10-CM | POA: Diagnosis not present

## 2023-12-25 ENCOUNTER — Emergency Department: Payer: 59

## 2023-12-25 ENCOUNTER — Emergency Department
Admission: EM | Admit: 2023-12-25 | Discharge: 2023-12-25 | Disposition: A | Discharge status: Home / Self Care | Payer: 59 | Attending: Emergency Medicine | Admitting: Emergency Medicine

## 2023-12-25 ENCOUNTER — Other Ambulatory Visit: Payer: Self-pay

## 2023-12-25 ENCOUNTER — Encounter: Payer: Self-pay | Admitting: Intensive Care

## 2023-12-25 DIAGNOSIS — M25461 Effusion, right knee: Secondary | ICD-10-CM | POA: Diagnosis not present

## 2023-12-25 DIAGNOSIS — W19XXXA Unspecified fall, initial encounter: Secondary | ICD-10-CM | POA: Insufficient documentation

## 2023-12-25 DIAGNOSIS — M25561 Pain in right knee: Secondary | ICD-10-CM

## 2023-12-25 DIAGNOSIS — I10 Essential (primary) hypertension: Secondary | ICD-10-CM | POA: Insufficient documentation

## 2023-12-25 DIAGNOSIS — S8991XA Unspecified injury of right lower leg, initial encounter: Secondary | ICD-10-CM | POA: Diagnosis not present

## 2023-12-25 DIAGNOSIS — M1711 Unilateral primary osteoarthritis, right knee: Secondary | ICD-10-CM | POA: Diagnosis not present

## 2023-12-25 DIAGNOSIS — R6889 Other general symptoms and signs: Secondary | ICD-10-CM | POA: Diagnosis not present

## 2023-12-25 MED ORDER — IBUPROFEN 800 MG PO TABS: 800.0000 mg | ORAL_TABLET | Freq: Three times a day (TID) | ORAL | 2 refills | Status: AC | PRN

## 2023-12-25 MED ORDER — ACETAMINOPHEN 500 MG PO TABS
1000.0000 mg | ORAL_TABLET | Freq: Once | ORAL | Status: AC
  Administered 2023-12-25: 1000 mg via ORAL
  Filled 2023-12-25: qty 2

## 2023-12-25 MED ORDER — ACETAMINOPHEN 325 MG PO TABS: 650.0000 mg | ORAL_TABLET | Freq: Four times a day (QID) | ORAL | 2 refills | Status: AC | PRN

## 2023-12-25 NOTE — ED Notes (Signed)
 Pt road tested with crutches at this time. Pt able to ambulate well with crutches.

## 2023-12-25 NOTE — ED Provider Notes (Signed)
 [facility] Provider Note    Event Date/Time   First MD Initiated Contact with Patient 12/25/23 Trevor Harmon     (approximate)   History   Knee Injury   HPI  Trevor Harmon is a 58 y.o. male with PMH of hypertension and GERD who presents for evaluation of an injury to his right knee.  Patient fell on it earlier today.  He reports pain and swelling localized to the knee.  He has had difficulty walking on it since the injury.      Physical Exam   Triage Vital Signs: ED Triage Vitals  Encounter Vitals Group     BP 12/25/23 1826 (!) 169/115     Systolic BP Percentile --      Diastolic BP Percentile --      Pulse Rate 12/25/23 1826 89     Resp 12/25/23 1826 18     Temp 12/25/23 1824 98.5 F (36.9 C)     Temp Source 12/25/23 1824 Oral     SpO2 12/25/23 1826 99 %     Weight 12/25/23 1825 160 lb (72.6 kg)     Height 12/25/23 1825 6' (1.829 m)     Head Circumference --      Peak Flow --      Pain Score 12/25/23 1824 10     Pain Loc --      Pain Education --      Exclude from Growth Chart --     Most recent vital signs: Vitals:   12/25/23 1826 12/25/23 1934  BP: (!) 169/115 (!) 131/93  Pulse: 89 73  Resp: 18 18  Temp:  98.1 F (36.7 C)  SpO2: 99% 99%   General: Awake, no distress.  CV:  Good peripheral perfusion.  Resp:  Normal effort. Abd:  No distention R knee: Knee is swollen when compared to the left side, no overlying skin changes, bruising, erythema or warmth, tender to palpation just distal to the patella, range of motion limited due to patient's pain, patient able to flex his knee to about 110 degrees, no ligament laxity felt with varus and valgus stress, negative Lachman's, unable to assess anterior/posterior drawer and McMurray's due to patient's pain.   ED Results / Procedures / Treatments   Labs (all labs ordered are listed, but only abnormal results are displayed) Labs Reviewed - No data to display   RADIOLOGY  Right knee x-ray  obtained, I interpreted the images as well as reviewed the radiologist report which was negative for any acute abnormalities.  There is some evidence of mild degenerative disease and moderate joint effusion which is consistent with previous imaging.   PROCEDURES:  Critical Care performed: No  Procedures   MEDICATIONS ORDERED IN ED: Medications  acetaminophen (TYLENOL) tablet 1,000 mg (has no administration in time range)     IMPRESSION / MDM / ASSESSMENT AND PLAN / ED COURSE  I reviewed the triage vital signs and the nursing notes.                             58 year old male presents for evaluation of right knee pain after he fell on it earlier today.  Patient is hypertensive in triage but does have a history of hypertension.  Vital signs are stable otherwise.  Patient NAD on exam.  Differential diagnosis includes, but is not limited to, fracture, meniscus injury, ligament injury, muscle strain, septic joint, arthritis.  Patient's  presentation is most consistent with acute complicated illness / injury requiring diagnostic workup.  On exam patient's right knee is swollen when compared to the left and he does have some tenderness to palpation.  X-rays showed moderate joint effusion.  My suspicion for septic joint is very low given patient's history of trauma, his ability to still perform some ROM although limited and no warmth or erythema on exam.  Patient does not have systemic symptoms.  An ace wrap was applied for compression. He was given crutches. He can weight bear as tolerated. We discussed symptomatic management using tylenol, ibuprofen, and ice. He was given a note for work. All questions were answered and he was stable at discharge.       FINAL CLINICAL IMPRESSION(S) / ED DIAGNOSES   Final diagnoses:  Acute pain of right knee     Rx / DC Orders   ED Discharge Orders          Ordered    acetaminophen (TYLENOL) 325 MG tablet  Every 6 hours PRN        12/25/23 2010     ibuprofen (ADVIL) 800 MG tablet  Every 8 hours PRN        12/25/23 2010             Note:  This document was prepared using Dragon voice recognition software and may include unintentional dictation errors.   Cameron Ali, PA-C 12/25/23 2010    Trinna Post, MD 12/25/23 [phone]

## 2023-12-25 NOTE — Discharge Instructions (Addendum)
 You can take 650 mg of Tylenol and 600 mg of ibuprofen every 6 hours as needed for pain.  Wear the Ace wrap, ice and elevate your knee to help decrease your swelling which will improve your pain.  You can follow-up with orthopedics, whose information is attached, if you continue to have pain.

## 2023-12-25 NOTE — ED Triage Notes (Signed)
 Arrived by [facility] from home for fall. Mechanical fall. Pain and swelling to right knee.  EMS vitals: A&O x4 155/103b/p 86HR 97% RA

## 2024-03-05 ENCOUNTER — Emergency Department

## 2024-03-05 ENCOUNTER — Other Ambulatory Visit: Payer: Self-pay

## 2024-03-05 ENCOUNTER — Emergency Department
Admission: EM | Admit: 2024-03-05 | Discharge: 2024-03-05 | Disposition: A | Discharge status: Home / Self Care | Attending: Emergency Medicine | Admitting: Emergency Medicine

## 2024-03-05 DIAGNOSIS — M25511 Pain in right shoulder: Secondary | ICD-10-CM | POA: Diagnosis not present

## 2024-03-05 DIAGNOSIS — I10 Essential (primary) hypertension: Secondary | ICD-10-CM | POA: Insufficient documentation

## 2024-03-05 DIAGNOSIS — S4991XA Unspecified injury of right shoulder and upper arm, initial encounter: Secondary | ICD-10-CM | POA: Diagnosis not present

## 2024-03-05 DIAGNOSIS — S4981XA Other specified injuries of right shoulder and upper arm, initial encounter: Secondary | ICD-10-CM | POA: Diagnosis not present

## 2024-03-05 MED ORDER — KETOROLAC TROMETHAMINE 30 MG/ML IJ SOLN
30.0000 mg | Freq: Once | INTRAMUSCULAR | Status: AC
  Administered 2024-03-05: 30 mg via INTRAMUSCULAR
  Filled 2024-03-05: qty 1
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
## 2024-03-05 NOTE — Discharge Instructions (Signed)
 Your evaluated in the ED for shoulder pain.  Your x-ray is normal.  We have placed you in a sling and you are encouraged to remain in the sling for comfort.  Apply ice to the affected area.  Follow-up with orthopedics if symptoms not improved.  Alternate taking Tylenol  and ibuprofen  for pain.

## 2024-07-05 ENCOUNTER — Other Ambulatory Visit: Payer: Self-pay

## 2024-07-05 ENCOUNTER — Emergency Department
Admission: EM | Admit: 2024-07-05 | Discharge: 2024-07-05 | Disposition: A | Discharge status: Home / Self Care | Attending: Emergency Medicine | Admitting: Emergency Medicine

## 2024-07-05 ENCOUNTER — Encounter: Payer: Self-pay | Admitting: Emergency Medicine

## 2024-07-05 ENCOUNTER — Emergency Department

## 2024-07-05 DIAGNOSIS — X509XXA Other and unspecified overexertion or strenuous movements or postures, initial encounter: Secondary | ICD-10-CM | POA: Diagnosis not present

## 2024-07-05 DIAGNOSIS — S86911A Strain of unspecified muscle(s) and tendon(s) at lower leg level, right leg, initial encounter: Secondary | ICD-10-CM | POA: Diagnosis not present

## 2024-07-05 DIAGNOSIS — Y92512 Supermarket, store or market as the place of occurrence of the external cause: Secondary | ICD-10-CM | POA: Insufficient documentation

## 2024-07-05 DIAGNOSIS — I10 Essential (primary) hypertension: Secondary | ICD-10-CM | POA: Diagnosis not present

## 2024-07-05 DIAGNOSIS — M25561 Pain in right knee: Secondary | ICD-10-CM | POA: Diagnosis not present

## 2024-07-05 MED ORDER — KETOROLAC TROMETHAMINE 30 MG/ML IJ SOLN
30.0000 mg | Freq: Once | INTRAMUSCULAR | Status: AC
  Administered 2024-07-05: 30 mg via INTRAMUSCULAR
  Filled 2024-07-05: qty 1

## 2024-07-05 NOTE — Discharge Instructions (Signed)
 You were evaluated in the ED for right knee pain.  Your x-rays are normal.  Please get plenty of rest and limit your physical activity.  You can alternate applying ice and a heating pad to the affected area for comfort.  Pain control:  Ibuprofen  (motrin /aleve/advil ) - You can take 3 tablets (600 mg) every 6 hours as needed for pain/fever.  Acetaminophen  (tylenol ) - You can take 2 extra strength tablets (1000 mg) every 6 hours as needed for pain/fever.  You can alternate these medications or take them together.  Make sure you eat food/drink water when taking these medications.

## 2024-07-05 NOTE — ED Triage Notes (Signed)
 First Nurse Note: Patient to ED via [facility] from home for right knee pain. Injured it in Feb- started hurting again yesterday.

## 2024-07-05 NOTE — ED Provider Notes (Signed)
 [facility] Emergency Department Provider Note     Event Date/Time   First MD Initiated Contact with Patient 07/05/24 1735     (approximate)   History   Knee Pain   HPI  Trevor Harmon is a 58 y.o. male with a history of HTN and GERD presents to the ED for evaluation of right knee pain x 1 day.  Denies injury or falls.  Patient reports he was working at [employer] a new employee when his right knee began to bother him to the point that he could barely walk. Denies history of injury or surgical history to this knee.      Physical Exam   Triage Vital Signs: ED Triage Vitals  Encounter Vitals Group     BP 07/05/24 1732 (!) 146/86     Girls Systolic BP Percentile --      Girls Diastolic BP Percentile --      Boys Systolic BP Percentile --      Boys Diastolic BP Percentile --      Pulse Rate 07/05/24 1732 62     Resp 07/05/24 1732 17     Temp 07/05/24 1732 98.1 F (36.7 C)     Temp Source 07/05/24 1732 Oral     SpO2 07/05/24 1732 99 %     Weight 07/05/24 1731 160 lb (72.6 kg)     Height 07/05/24 1731 6' (1.829 m)     Head Circumference --      Peak Flow --      Pain Score 07/05/24 1730 10     Pain Loc --      Pain Education --      Exclude from Growth Chart --     Most recent vital signs: Vitals:   07/05/24 1732  BP: (!) 146/86  Pulse: 62  Resp: 17  Temp: 98.1 F (36.7 C)  SpO2: 99%    General Awake, no distress.  HEENT NCAT. CV:  Good peripheral perfusion.  RESP:  Normal effort.  ABD:  No distention.  Other:  Right knee reveals no deformity.  Nontender to palpation over patella.  Full active range of motion without difficulty.  Negative varus and valgus stress test.  Neurovascular status intact all throughout.   ED Results / Procedures / Treatments   Labs (all labs ordered are listed, but only abnormal results are displayed) Labs Reviewed - No data to display  RADIOLOGY  I personally viewed and evaluated  these images as part of my medical decision making, as well as reviewing the written report by the radiologist.  ED Provider Interpretation: Right knee x-ray appears normal.  DG Knee Complete 4 Views Right Result Date: 07/05/2024 CLINICAL DATA:  RIGHT knee pain EXAM: RIGHT KNEE - COMPLETE 4+ VIEW COMPARISON:  None Available. FINDINGS: No fracture of the proximal tibia or distal femur. Patella is normal. No joint effusion. IMPRESSION: No fracture or dislocation. Electronically Signed   By: Jackquline Boxer M.D.   On: 07/05/2024 18:15    PROCEDURES:  Critical Care performed: No  Procedures   MEDICATIONS ORDERED IN ED: Medications  ketorolac  (TORADOL ) 30 MG/ML injection 30 mg (30 mg Intramuscular Given 07/05/24 1801)     IMPRESSION / MDM / ASSESSMENT AND PLAN / ED COURSE  I reviewed the triage vital signs and the nursing notes.  58 y.o. male presents to the emergency department for evaluation and treatment of acute nontraumatic right knee pain. See HPI for further details.   Differential diagnosis includes, but is not limited to fracture, dislocation, effusion, strain, arthritis  Patient's presentation is most consistent with acute complicated illness / injury requiring diagnostic workup.  Patient is alert and oriented.  He is hemodynamically stable.  Physical exam findings are stated above and overall benign.  X-rays reassuring.  Presentation clinically consistent with strain versus arthritis given history and physical exam findings.  RICE therapy education provided and patient verbalized understanding.  Work note requested and provided at discharge.  Patient is stable condition for discharge home.  ED return precautions discussed.  FINAL CLINICAL IMPRESSION(S) / ED DIAGNOSES   Final diagnoses:  Strain of right knee, initial encounter   Rx / DC Orders   ED Discharge Orders     None        Note:  This document was prepared using Dragon voice  recognition software and may include unintentional dictation errors.    Margrette, Lavana Huckeba A, PA-C 07/05/24 1830    Willo Dunnings, MD 07/05/24 TYRA
